# Patient Record
Sex: Female | Born: 1940 | Race: White | Hispanic: No | Smoking: Never smoker
Health system: Southern US, Community
[De-identification: ages and names within clinical notes are randomized; demographics above are authoritative.]

## PROBLEM LIST (undated history)

## (undated) DIAGNOSIS — I4891 Unspecified atrial fibrillation: Secondary | ICD-10-CM

## (undated) DIAGNOSIS — I10 Essential (primary) hypertension: Secondary | ICD-10-CM

## (undated) DIAGNOSIS — I639 Cerebral infarction, unspecified: Secondary | ICD-10-CM

## (undated) DIAGNOSIS — G459 Transient cerebral ischemic attack, unspecified: Secondary | ICD-10-CM

## (undated) DIAGNOSIS — E119 Type 2 diabetes mellitus without complications: Secondary | ICD-10-CM

## (undated) HISTORY — PX: ABDOMINAL HYSTERECTOMY: SHX81

## (undated) HISTORY — PX: CHOLECYSTECTOMY: SHX55

---

## 2016-10-17 ENCOUNTER — Inpatient Hospital Stay (HOSPITAL_BASED_OUTPATIENT_CLINIC_OR_DEPARTMENT_OTHER)
Admission: EM | Admit: 2016-10-17 | Discharge: 2016-10-19 | DRG: 063 | Disposition: A | Discharge status: Home / Self Care | Payer: Medicare Other | Attending: Neurology | Admitting: Neurology

## 2016-10-17 ENCOUNTER — Emergency Department (HOSPITAL_BASED_OUTPATIENT_CLINIC_OR_DEPARTMENT_OTHER): Payer: Medicare Other

## 2016-10-17 ENCOUNTER — Encounter (HOSPITAL_BASED_OUTPATIENT_CLINIC_OR_DEPARTMENT_OTHER): Payer: Self-pay | Admitting: *Deleted

## 2016-10-17 DIAGNOSIS — H5702 Anisocoria: Secondary | ICD-10-CM

## 2016-10-17 DIAGNOSIS — I482 Chronic atrial fibrillation, unspecified: Secondary | ICD-10-CM

## 2016-10-17 DIAGNOSIS — I10 Essential (primary) hypertension: Secondary | ICD-10-CM | POA: Diagnosis present

## 2016-10-17 DIAGNOSIS — E669 Obesity, unspecified: Secondary | ICD-10-CM | POA: Diagnosis present

## 2016-10-17 DIAGNOSIS — I639 Cerebral infarction, unspecified: Secondary | ICD-10-CM | POA: Diagnosis not present

## 2016-10-17 DIAGNOSIS — Z90710 Acquired absence of both cervix and uterus: Secondary | ICD-10-CM

## 2016-10-17 DIAGNOSIS — R29716 NIHSS score 16: Secondary | ICD-10-CM | POA: Diagnosis present

## 2016-10-17 DIAGNOSIS — I63432 Cerebral infarction due to embolism of left posterior cerebral artery: Principal | ICD-10-CM | POA: Diagnosis present

## 2016-10-17 DIAGNOSIS — E119 Type 2 diabetes mellitus without complications: Secondary | ICD-10-CM | POA: Diagnosis present

## 2016-10-17 DIAGNOSIS — Z6831 Body mass index (BMI) 31.0-31.9, adult: Secondary | ICD-10-CM

## 2016-10-17 DIAGNOSIS — Z9049 Acquired absence of other specified parts of digestive tract: Secondary | ICD-10-CM

## 2016-10-17 DIAGNOSIS — I959 Hypotension, unspecified: Secondary | ICD-10-CM | POA: Diagnosis present

## 2016-10-17 DIAGNOSIS — Z8673 Personal history of transient ischemic attack (TIA), and cerebral infarction without residual deficits: Secondary | ICD-10-CM

## 2016-10-17 DIAGNOSIS — E785 Hyperlipidemia, unspecified: Secondary | ICD-10-CM | POA: Diagnosis present

## 2016-10-17 DIAGNOSIS — Z7901 Long term (current) use of anticoagulants: Secondary | ICD-10-CM

## 2016-10-17 DIAGNOSIS — R4701 Aphasia: Secondary | ICD-10-CM | POA: Diagnosis present

## 2016-10-17 HISTORY — DX: Essential (primary) hypertension: I10

## 2016-10-17 HISTORY — DX: Type 2 diabetes mellitus without complications: E11.9

## 2016-10-17 HISTORY — DX: Cerebral infarction, unspecified: I63.9

## 2016-10-17 HISTORY — DX: Unspecified atrial fibrillation: I48.91

## 2016-10-17 LAB — COMPREHENSIVE METABOLIC PANEL
ALBUMIN: 4 g/dL (ref 3.5–5.0)
ALK PHOS: 55 U/L (ref 38–126)
ALT: 23 U/L (ref 14–54)
AST: 22 U/L (ref 15–41)
Anion gap: 8 (ref 5–15)
BILIRUBIN TOTAL: 0.5 mg/dL (ref 0.3–1.2)
BUN: 29 mg/dL — ABNORMAL HIGH (ref 6–20)
CALCIUM: 9.3 mg/dL (ref 8.9–10.3)
CO2: 28 mmol/L (ref 22–32)
CREATININE: 1.04 mg/dL — AB (ref 0.44–1.00)
Chloride: 102 mmol/L (ref 101–111)
GFR calc non Af Amer: 50 mL/min — ABNORMAL LOW (ref >60)
GFR, EST AFRICAN AMERICAN: 59 mL/min — AB (ref >60)
GLUCOSE: 171 mg/dL — AB (ref 65–99)
Potassium: 3.8 mmol/L (ref 3.5–5.1)
SODIUM: 138 mmol/L (ref 135–145)
Total Protein: 7.1 g/dL (ref 6.5–8.1)

## 2016-10-17 LAB — DIFFERENTIAL
Basophils Absolute: 0 10*3/uL (ref 0.0–0.1)
Basophils Relative: 0 %
EOS PCT: 1 %
Eosinophils Absolute: 0.1 10*3/uL (ref 0.0–0.7)
LYMPHS PCT: 26 %
Lymphs Abs: 2.3 10*3/uL (ref 0.7–4.0)
MONOS PCT: 9 %
Monocytes Absolute: 0.8 10*3/uL (ref 0.1–1.0)
NEUTROS ABS: 5.8 10*3/uL (ref 1.7–7.7)
NEUTROS PCT: 64 %

## 2016-10-17 LAB — CBC
HEMATOCRIT: 39.3 % (ref 36.0–46.0)
HEMOGLOBIN: 13.3 g/dL (ref 12.0–15.0)
MCH: 31.4 pg (ref 26.0–34.0)
MCHC: 33.8 g/dL (ref 30.0–36.0)
MCV: 92.9 fL (ref 78.0–100.0)
Platelets: 185 10*3/uL (ref 150–400)
RBC: 4.23 MIL/uL (ref 3.87–5.11)
RDW: 12.4 % (ref 11.5–15.5)
WBC: 9 10*3/uL (ref 4.0–10.5)

## 2016-10-17 LAB — ETHANOL

## 2016-10-17 LAB — TROPONIN I: Troponin I: <0.03 ng/mL (ref <0.03)

## 2016-10-17 LAB — APTT: aPTT: 33 seconds (ref 24–36)

## 2016-10-17 LAB — PROTIME-INR
INR: 1.55
Prothrombin Time: 18.7 seconds — ABNORMAL HIGH (ref 11.4–15.2)

## 2016-10-17 NOTE — ED Notes (Signed)
Pt with right upper extremity weakness 40 min PTA per family.

## 2016-10-17 NOTE — ED Provider Notes (Signed)
[facility] Provider Note   CSN: [id number] Arrival date & time: 10/17/16  2232     History   Chief Complaint Chief Complaint  Patient presents with  . Extremity Weakness    HPI Alexa Fox is a 76 y.o. female.  HPI Patient was at normal baseline at this evening. Family had had a party and she was cleaning up. At 945 she reported that her right arm was not working right. She was not able to elevate it or move it. Her daughter tried to ask her questions and she was having a difficult time responding to questions. She did not have fall or injury. No loss of consciousness. Patient has history of prior stroke approximately 8 years ago. She is anticoagulated for chronic atrial fibrillation on Coumadin 2.5 mg daily. Past Medical History:  Diagnosis Date  . A-fib (HCC)   . Diabetes mellitus without complication (HCC)   . Hypertension   . Stroke [facility](HCC)     There are no active problems to display for this patient.   Past Surgical History:  Procedure Laterality Date  . ABDOMINAL HYSTERECTOMY    . CHOLECYSTECTOMY      OB History    No data available       Home Medications    Prior to Admission medications   Medication Sig Start Date End Date Taking? Authorizing Provider  furosemide (LASIX) 40 MG tablet Take 40 mg by mouth.   Yes [provider]  metFORMIN (GLUMETZA) 500 MG (MOD) 24 hr tablet Take 500 mg by mouth daily with breakfast.   Yes [provider]  metoprolol succinate (TOPROL-XL) 50 MG 24 hr tablet Take 50 mg by mouth daily. Take with or immediately following a meal.   Yes [provider]  potassium chloride SA (K-DUR,KLOR-CON) 20 MEQ tablet Take 20 mEq by mouth 2 (two) times daily.   Yes [provider]  simvastatin (ZOCOR) 20 MG tablet Take 20 mg by mouth daily.   Yes [provider]  warfarin (COUMADIN) 4 MG tablet Take 4 mg by mouth daily.   Yes [provider]    Family History History  reviewed. No pertinent family history.  Social History Social History  Substance Use Topics  . Smoking status: Never Smoker  . Smokeless tobacco: Never Used  . Alcohol use No     Allergies   Patient has no known allergies.   Review of Systems Review of Systems 10 Systems reviewed and are negative for acute change except as noted in the HPI.   Physical Exam Updated Vital Signs There were no vitals taken for this visit.  Physical Exam  Constitutional: No distress.  Patient is awake and alert with protected airway. No respiratory distress.  HENT:  Head: Normocephalic and atraumatic.  Mouth/Throat: Oropharynx is clear and moist.  No pooling secretions or sonorous breathing.  Eyes: Conjunctivae and EOM are normal. Pupils are equal, round, and reactive to light.  Neck: Neck supple.  Cardiovascular:  No murmur heard. Irregularly irregular, rate controlled.  Pulmonary/Chest: Effort normal and breath sounds normal. No respiratory distress.  Abdominal: Soft. There is no tenderness.  Musculoskeletal: She exhibits no edema or tenderness.  Neurological: She is alert.  Patient is awake and alert but seems to have fairly flat affect. Is able to say her name but sleep just slightly slurred. Left facial droop. Patient has focal weakness on the right. She can make some effort to elevate the right upper extremity but she cannot hold  it off the bed. This decreased. She is able to elevate left upper extremity. A command patient can elevate each lower extremity independently.  Skin: Skin is warm and dry.  Psychiatric: She has a normal mood and affect.  Nursing note and vitals reviewed.    ED Treatments / Results  Labs (all labs ordered are listed, but only abnormal results are displayed) Labs Reviewed  PROTIME-INR  ETHANOL  APTT  CBC  DIFFERENTIAL  COMPREHENSIVE METABOLIC PANEL  RAPID URINE DRUG SCREEN, HOSP PERFORMED  URINALYSIS, ROUTINE W REFLEX MICROSCOPIC  TROPONIN I     EKG  EKG Interpretation None       Radiology No results found.  Procedures Procedures (including critical care time) CRITICAL CARE Performed by: Arby Barrette   Total critical care time: 30 minutes  Critical care time was exclusive of separately billable procedures and treating other patients.  Critical care was necessary to treat or prevent imminent or life-threatening deterioration.  Critical care was time spent personally by me on the following activities: development of treatment plan with patient and/or surrogate as well as nursing, discussions with consultants, evaluation of patient's response to treatment, examination of patient, obtaining history from patient or surrogate, ordering and performing treatments and interventions, ordering and review of laboratory studies, ordering and review of radiographic studies, pulse oximetry and re-evaluation of patient's condition. Medications Ordered in ED Medications - No data to display   Initial Impression / Assessment and Plan / ED Course  I have reviewed the triage vital signs and the nursing notes.  Pertinent labs & imaging results that were available during my care of the patient were reviewed by me and considered in my medical decision making (see chart for details).     Consult: Reviewed with Dr. Amada Jupiter for code stroke.  Final Clinical Impressions(s) / ED Diagnoses   Final diagnoses:  Cerebrovascular accident (CVA), unspecified mechanism (HCC)  Chronic atrial fibrillation (HCC)  Anticoagulated on Coumadin  Patient transferred to [facility] for acute CVA with planned administration of TPA per Dr. Amada Jupiter upon arrival to [facility], patient will be within TPA window. Patient remains alert and appropriate with stable VS at transfer. No airway compromise. Patient had acute onset of right-sided weakness and slurred speech. Patient is anticoagulated on coumadin.  New Prescriptions New Prescriptions   No  medications on file     Arby Barrette, MD 10/25/16 [phone]

## 2016-10-17 NOTE — ED Triage Notes (Signed)
Pt with right arm weakness 30 min PTA

## 2016-10-18 ENCOUNTER — Inpatient Hospital Stay (HOSPITAL_COMMUNITY): Payer: Medicare Other

## 2016-10-18 ENCOUNTER — Emergency Department (HOSPITAL_COMMUNITY): Payer: Medicare Other

## 2016-10-18 ENCOUNTER — Encounter (HOSPITAL_COMMUNITY): Payer: Self-pay | Admitting: *Deleted

## 2016-10-18 DIAGNOSIS — E785 Hyperlipidemia, unspecified: Secondary | ICD-10-CM | POA: Diagnosis present

## 2016-10-18 DIAGNOSIS — Z7901 Long term (current) use of anticoagulants: Secondary | ICD-10-CM | POA: Diagnosis not present

## 2016-10-18 DIAGNOSIS — I360 Nonrheumatic tricuspid (valve) stenosis: Secondary | ICD-10-CM | POA: Diagnosis not present

## 2016-10-18 DIAGNOSIS — I63412 Cerebral infarction due to embolism of left middle cerebral artery: Secondary | ICD-10-CM | POA: Diagnosis not present

## 2016-10-18 DIAGNOSIS — R4701 Aphasia: Secondary | ICD-10-CM

## 2016-10-18 DIAGNOSIS — I639 Cerebral infarction, unspecified: Secondary | ICD-10-CM | POA: Diagnosis present

## 2016-10-18 DIAGNOSIS — I4891 Unspecified atrial fibrillation: Secondary | ICD-10-CM | POA: Diagnosis not present

## 2016-10-18 DIAGNOSIS — I482 Chronic atrial fibrillation: Secondary | ICD-10-CM | POA: Diagnosis present

## 2016-10-18 DIAGNOSIS — I959 Hypotension, unspecified: Secondary | ICD-10-CM | POA: Diagnosis present

## 2016-10-18 DIAGNOSIS — I6300 Cerebral infarction due to thrombosis of unspecified precerebral artery: Secondary | ICD-10-CM

## 2016-10-18 DIAGNOSIS — Z90710 Acquired absence of both cervix and uterus: Secondary | ICD-10-CM | POA: Diagnosis not present

## 2016-10-18 DIAGNOSIS — E119 Type 2 diabetes mellitus without complications: Secondary | ICD-10-CM | POA: Diagnosis present

## 2016-10-18 DIAGNOSIS — I10 Essential (primary) hypertension: Secondary | ICD-10-CM | POA: Diagnosis present

## 2016-10-18 DIAGNOSIS — E669 Obesity, unspecified: Secondary | ICD-10-CM | POA: Diagnosis present

## 2016-10-18 DIAGNOSIS — G459 Transient cerebral ischemic attack, unspecified: Secondary | ICD-10-CM

## 2016-10-18 DIAGNOSIS — Z6831 Body mass index (BMI) 31.0-31.9, adult: Secondary | ICD-10-CM | POA: Diagnosis not present

## 2016-10-18 DIAGNOSIS — Z8673 Personal history of transient ischemic attack (TIA), and cerebral infarction without residual deficits: Secondary | ICD-10-CM | POA: Diagnosis not present

## 2016-10-18 DIAGNOSIS — Z9049 Acquired absence of other specified parts of digestive tract: Secondary | ICD-10-CM | POA: Diagnosis not present

## 2016-10-18 DIAGNOSIS — I63432 Cerebral infarction due to embolism of left posterior cerebral artery: Secondary | ICD-10-CM | POA: Diagnosis present

## 2016-10-18 HISTORY — DX: Transient cerebral ischemic attack, unspecified: G45.9

## 2016-10-18 LAB — BASIC METABOLIC PANEL
Anion gap: 11 (ref 5–15)
BUN: 24 mg/dL — AB (ref 6–20)
CO2: 20 mmol/L — ABNORMAL LOW (ref 22–32)
CREATININE: 0.92 mg/dL (ref 0.44–1.00)
Calcium: 8.6 mg/dL — ABNORMAL LOW (ref 8.9–10.3)
Chloride: 109 mmol/L (ref 101–111)
GFR calc Af Amer: >60 mL/min (ref >60)
GFR, EST NON AFRICAN AMERICAN: 59 mL/min — AB (ref >60)
Glucose, Bld: 126 mg/dL — ABNORMAL HIGH (ref 65–99)
POTASSIUM: 4.1 mmol/L (ref 3.5–5.1)
SODIUM: 140 mmol/L (ref 135–145)

## 2016-10-18 LAB — RAPID URINE DRUG SCREEN, HOSP PERFORMED
Amphetamines: NOT DETECTED
Barbiturates: NOT DETECTED
Benzodiazepines: NOT DETECTED
Cocaine: NOT DETECTED
OPIATES: NOT DETECTED
TETRAHYDROCANNABINOL: NOT DETECTED

## 2016-10-18 LAB — LIPID PANEL
Cholesterol: 157 mg/dL (ref 0–200)
HDL: 55 mg/dL (ref >40)
LDL CALC: 85 mg/dL (ref 0–99)
TRIGLYCERIDES: 85 mg/dL (ref <150)
Total CHOL/HDL Ratio: 2.9 RATIO
VLDL: 17 mg/dL (ref 0–40)

## 2016-10-18 LAB — URINALYSIS, ROUTINE W REFLEX MICROSCOPIC
BACTERIA UA: NONE SEEN
BILIRUBIN URINE: NEGATIVE
Glucose, UA: NEGATIVE mg/dL
KETONES UR: NEGATIVE mg/dL
LEUKOCYTES UA: NEGATIVE
NITRITE: NEGATIVE
PH: 5 (ref 5.0–8.0)
Protein, ur: NEGATIVE mg/dL
Specific Gravity, Urine: 1.009 (ref 1.005–1.030)
Squamous Epithelial / LPF: NONE SEEN

## 2016-10-18 LAB — MRSA PCR SCREENING: MRSA by PCR: NEGATIVE

## 2016-10-18 LAB — GLUCOSE, CAPILLARY
GLUCOSE-CAPILLARY: 115 mg/dL — AB (ref 65–99)
GLUCOSE-CAPILLARY: 130 mg/dL — AB (ref 65–99)
GLUCOSE-CAPILLARY: 85 mg/dL (ref 65–99)
Glucose-Capillary: 111 mg/dL — ABNORMAL HIGH (ref 65–99)
Glucose-Capillary: 101 mg/dL — ABNORMAL HIGH (ref 65–99)

## 2016-10-18 LAB — CBC
HCT: 38.6 % (ref 36.0–46.0)
Hemoglobin: 12.5 g/dL (ref 12.0–15.0)
MCH: 30.3 pg (ref 26.0–34.0)
MCHC: 32.4 g/dL (ref 30.0–36.0)
MCV: 93.5 fL (ref 78.0–100.0)
PLATELETS: 140 10*3/uL — AB (ref 150–400)
RBC: 4.13 MIL/uL (ref 3.87–5.11)
RDW: 12.8 % (ref 11.5–15.5)
WBC: 9.7 10*3/uL (ref 4.0–10.5)

## 2016-10-18 MED ORDER — SODIUM CHLORIDE 0.9 % IV BOLUS (SEPSIS)
1000.0000 mL | Freq: Once | INTRAVENOUS | Status: AC
  Administered 2016-10-18: 1000 mL via INTRAVENOUS

## 2016-10-18 MED ORDER — SIMVASTATIN 20 MG PO TABS
20.0000 mg | ORAL_TABLET | Freq: Every day | ORAL | Status: DC
  Administered 2016-10-18: 20 mg via ORAL
  Filled 2016-10-18: qty 1

## 2016-10-18 MED ORDER — ALBUMIN HUMAN 5 % IV SOLN
12.5000 g | Freq: Once | INTRAVENOUS | Status: AC
  Administered 2016-10-18: 12.5 g via INTRAVENOUS
  Filled 2016-10-18: qty 250

## 2016-10-18 MED ORDER — ALTEPLASE (STROKE) FULL DOSE INFUSION
0.9000 mg/kg | Freq: Once | INTRAVENOUS | Status: AC
  Administered 2016-10-18: 73 mg via INTRAVENOUS
  Filled 2016-10-18: qty 100

## 2016-10-18 MED ORDER — TRANEXAMIC ACID 1000 MG/10ML IV SOLN: 1000.0000 mg | INTRAVENOUS | Status: DC

## 2016-10-18 MED ORDER — ACETAMINOPHEN 325 MG PO TABS: 650.0000 mg | ORAL_TABLET | ORAL | Status: DC | PRN

## 2016-10-18 MED ORDER — SODIUM CHLORIDE 0.9 % IV BOLUS (SEPSIS)
500.0000 mL | Freq: Once | INTRAVENOUS | Status: AC
  Administered 2016-10-18: 500 mL via INTRAVENOUS

## 2016-10-18 MED ORDER — ACETAMINOPHEN 650 MG RE SUPP
650.0000 mg | RECTAL | Status: DC | PRN
  Administered 2016-10-18 (×2): 650 mg via RECTAL
  Filled 2016-10-18 (×2): qty 1

## 2016-10-18 MED ORDER — ONDANSETRON HCL 4 MG/2ML IJ SOLN
4.0000 mg | Freq: Four times a day (QID) | INTRAMUSCULAR | Status: DC | PRN
  Administered 2016-10-18: 4 mg via INTRAVENOUS
  Filled 2016-10-18: qty 2

## 2016-10-18 MED ORDER — STROKE: EARLY STAGES OF RECOVERY BOOK
Freq: Once | Status: AC
  Administered 2016-10-18: 1
  Filled 2016-10-18: qty 1

## 2016-10-18 MED ORDER — LABETALOL HCL 5 MG/ML IV SOLN: 20.0000 mg | Freq: Once | INTRAVENOUS | Status: DC | PRN

## 2016-10-18 MED ORDER — CHLORHEXIDINE GLUCONATE 0.12 % MT SOLN
15.0000 mL | Freq: Two times a day (BID) | OROMUCOSAL | Status: DC
  Administered 2016-10-18: 15 mL via OROMUCOSAL

## 2016-10-18 MED ORDER — ORAL CARE MOUTH RINSE
15.0000 mL | Freq: Two times a day (BID) | OROMUCOSAL | Status: DC
  Administered 2016-10-18: 15 mL via OROMUCOSAL

## 2016-10-18 MED ORDER — IOPAMIDOL (ISOVUE-370) INJECTION 76%
INTRAVENOUS | Status: AC
  Administered 2016-10-18: 50 mL via INTRAVENOUS
  Filled 2016-10-18: qty 50

## 2016-10-18 MED ORDER — ACETAMINOPHEN 160 MG/5ML PO SOLN: 650.0000 mg | ORAL | Status: DC | PRN

## 2016-10-18 MED ORDER — GI COCKTAIL ~~LOC~~
30.0000 mL | Freq: Once | ORAL | Status: AC
  Administered 2016-10-19: 30 mL via ORAL
  Filled 2016-10-18: qty 30

## 2016-10-18 MED ORDER — NICARDIPINE HCL IN NACL 20-0.86 MG/200ML-% IV SOLN: 0.0000 mg/h | Freq: Once | INTRAVENOUS | Status: DC | PRN

## 2016-10-18 MED ORDER — SODIUM CHLORIDE 0.9 % IV SOLN
INTRAVENOUS | Status: DC
  Administered 2016-10-18: 03:00:00 via INTRAVENOUS

## 2016-10-18 MED ORDER — INSULIN ASPART 100 UNIT/ML ~~LOC~~ SOLN
2.0000 [IU] | SUBCUTANEOUS | Status: DC
  Administered 2016-10-18: 2 [IU] via SUBCUTANEOUS
  Administered 2016-10-19: 4 [IU] via SUBCUTANEOUS

## 2016-10-18 MED ORDER — PANTOPRAZOLE SODIUM 40 MG IV SOLR
40.0000 mg | Freq: Every day | INTRAVENOUS | Status: DC
  Administered 2016-10-18: 40 mg via INTRAVENOUS
  Filled 2016-10-18: qty 40

## 2016-10-18 NOTE — ED Notes (Signed)
Family at bedside reports patient was shaking R foot [facility]

## 2016-10-18 NOTE — ED Notes (Signed)
tPA started at [facility]0016

## 2016-10-18 NOTE — Progress Notes (Signed)
Preliminary results by tech - Carotid Duplex Completed. No evidence of significant stenosis in bilateral carotid arteries.  Marilynne Halstedita Rubyann Lingle, BS, RDMS, RVT

## 2016-10-18 NOTE — Progress Notes (Signed)
Patient's BP still not at goal, MAP >65 and SBP>100. Neurology MD made aware. Albumin ordered. Will continue to monitor.

## 2016-10-18 NOTE — Evaluation (Signed)
Physical Therapy Evaluation Patient Details Name: Alexa Fox MRN: [id number] DOB: 06/14/1940 Today's Date: 10/18/2016   History of Present Illness  Alexa BoucheSharon Jenkinsis a 76 y.o.femalewith a past medical history of A. fib, diabetes, hypertension, stroke who presents with acute onset right-sided weakness and aphasia. tpA given.  Clinical Impression  Pt admitted with above. Pt functioning near baseline. Pt mildly unsteady with R sided weakness however pt sleepy due to not having slept all night. PT to see pt tomorrow and reassess d/c recs. Pt from [place] but here visiting daughter and will go home with daughter and per dtr pt to move in with another daughter back in [place] when she returns.    Follow Up Recommendations Home health PT;Supervision/Assistance - 24 hour (however may progress and not need HHPT)    Equipment Recommendations  None recommended by PT (may benefit from cane)    Recommendations for Other Services       Precautions / Restrictions Precautions Precautions: Fall Restrictions Weight Bearing Restrictions: No      Mobility  Bed Mobility Overal bed mobility: Needs Assistance Bed Mobility: Supine to Sit;Sit to Supine     Supine to sit: Min guard Sit to supine: Min guard   General bed mobility comments: labored effort but able to complete without physical assist,  Transfers Overall transfer level: Needs assistance Equipment used: None Transfers: Sit to/from Stand Sit to Stand: Min assist         General transfer comment: minA for stability and safety  Ambulation/Gait Ambulation/Gait assistance: Min assist Ambulation Distance (Feet): 100 Feet Assistive device: 1 person hand held assist Gait Pattern/deviations: Step-through pattern;Decreased stride length;Shuffle Gait velocity: slow Gait velocity interpretation: Below normal speed for age/gender General Gait Details: pt mildly unsteady with lateral sway L to R  Stairs            Wheelchair  Mobility    Modified Rankin (Stroke Patients Only) Modified Rankin (Stroke Patients Only) Pre-Morbid Rankin Score: No significant disability Modified Rankin: Moderate disability     Balance Overall balance assessment: Needs assistance Sitting-balance support: Feet supported;No upper extremity supported Sitting balance-Leahy Scale: Good     Standing balance support: No upper extremity supported;During functional activity Standing balance-Leahy Scale: Fair Standing balance comment: pt stood at sink to wash hands, mild LOB due to uneven floor                             Pertinent Vitals/Pain Pain Assessment: No/denies pain    Home Living Family/patient expects to be discharged to:: Private residence Living Arrangements: Alone (visiting from [place], will go home with dtr) Available Help at Discharge: Family;Available 24 hours/day Type of Home: House Home Access: Stairs to enter Entrance Stairs-Rails: Right Entrance Stairs-Number of Steps: 4/5 Home Layout: Two level Home Equipment: None Additional Comments: per daughter, when pt returns to [place] pt will move in with her daughter    Prior Function Level of Independence: Independent         Comments: pt has never driven, dtr takes her to grocery shop and MD appts     Hand Dominance   Dominant Hand: Right    Extremity/Trunk Assessment   Upper Extremity Assessment Upper Extremity Assessment: RUE deficits/detail RUE Deficits / Details: grossly 4/5    Lower Extremity Assessment Lower Extremity Assessment: RLE deficits/detail RLE Deficits / Details: groslly 4/5    Cervical / Trunk Assessment Cervical / Trunk Assessment: Normal  Communication   Communication: No  difficulties (soft spoken and slow to respond)  Cognition Arousal/Alertness: Awake/alert (but sleepy from not sleeping all night) Behavior During Therapy: Flat affect;Impulsive Overall Cognitive Status: Impaired/Different from baseline Area  of Impairment: Problem solving                             Problem Solving: Slow processing;Requires verbal cues;Requires tactile cues General Comments: pt with no consideration for multiple lines and IV      General Comments General comments (skin integrity, edema, etc.): pt tolieted self with supervision    Exercises     Assessment/Plan    PT Assessment Patient needs continued PT services  PT Problem List Decreased strength;Decreased activity tolerance;Decreased balance;Decreased mobility;Decreased coordination;Decreased cognition;Decreased knowledge of use of DME;Decreased safety awareness       PT Treatment Interventions DME instruction;Gait training;Stair training;Functional mobility training;Therapeutic activities;Therapeutic exercise;Balance training;Neuromuscular re-education;Cognitive remediation    PT Goals (Current goals can be found in the Care Plan section)  Acute Rehab PT Goals Patient Stated Goal: didn't state PT Goal Formulation: With patient/family Time For Goal Achievement: 11/01/16 Potential to Achieve Goals: Good Additional Goals Additional Goal #1: Pt to score >19 on DGI to indicate minimal falls risk.    Frequency Min 4X/week   Barriers to discharge        Co-evaluation               AM-PAC PT "6 Clicks" Daily Activity  Outcome Measure Difficulty turning over in bed (including adjusting bedclothes, sheets and blankets)?: A Little Difficulty moving from lying on back to sitting on the side of the bed? : A Little Difficulty sitting down on and standing up from a chair with arms (e.g., wheelchair, bedside commode, etc,.)?: A Little Help needed moving to and from a bed to chair (including a wheelchair)?: A Little Help needed walking in hospital room?: A Little Help needed climbing 3-5 steps with a railing? : A Lot 6 Click Score: 17    End of Session Equipment Utilized During Treatment: Gait belt Activity Tolerance: Patient  tolerated treatment well Patient left: in bed;with call bell/phone within reach;with bed alarm set;with family/visitor present Nurse Communication: Mobility status PT Visit Diagnosis: Unsteadiness on feet (R26.81)    Time: 1610-9604 PT Time Calculation (min) (ACUTE ONLY): 26 min   Charges:   PT Evaluation $PT Eval Moderate Complexity: 1 Procedure PT Treatments $Gait Training: 8-22 mins   PT G Codes:        Lewis Shock, PT, DPT Pager #: [phone] Office #: [phone]   Sasha Rogel M Zeynep Fantroy 10/18/2016, 4:37 PM

## 2016-10-18 NOTE — H&P (Signed)
Neurology H&P  CC: Aphasia  History is obtained from: Patient  HPI: Alexa Fox is a 76 y.o. female with a past medical history of A. fib, diabetes, hypertension, stroke who presents with acute onset right-sided weakness and aphasia. She is from [place] and is currently visiting. Her daughter states that, the left around 9:30, subsequently she came back in and found her to be aphasic. [facility]. Prior to transfer here, she did have some improvement and was able to speak some on [facility]. She was able to tell me that she had not had any vision problems with past few days, having no symptoms prior to onset of these symptoms.  I discussed IV TPA with her and her daughter and both agreed to proceed.   LKW: 9:30 PM tpa given?: Yes  ROS:  Unable to obtain due to altered mental status.   Past Medical History:  Diagnosis Date  . A-fib (HCC)   . Diabetes mellitus without complication (HCC)   . Hypertension   . Stroke [facility](HCC)      History reviewed. No pertinent family history.   Social History:  reports that she has never smoked. She has never used smokeless tobacco. She reports that she does not drink alcohol or use drugs.   Exam: Current vital signs: BP 100/81   Pulse 80   Temp 98.9 F (37.2 C) (Oral)   Resp 18   Wt 81.3 kg (179 lb 3.7 oz)   SpO2 95%  Vital signs in last 24 hours: Temp:  [98.9 F (37.2 C)] 98.9 F (37.2 C) (07/10 2322) Pulse Rate:  [68-87] 80 (07/11 0101) Resp:  [16-22] 18 (07/11 0101) BP: (100-123)/(50-81) 100/81 (07/11 0101) SpO2:  [95 %-97 %] 95 % (07/11 0101) Weight:  [81.3 kg (179 lb 3.7 oz)] 81.3 kg (179 lb 3.7 oz) (07/11 0000)  Physical Exam  Constitutional: Appears well-developed and well-nourished.  Psych: Affect appropriate to situation Eyes: No scleral injection HENT: No OP obstrucion Head: Normocephalic.  Cardiovascular: Normal rate and regular rhythm.   Respiratory: Effort normal and breath sounds normal to anterior ascultation GI: Soft.  No distension. There is no tenderness.  Skin: WDI  Neuro: Mental Status: Patient is Awake, alert, mild aphasia on arrival, this progressed to moderate to severe aphasia. Cranial Nerves: II: Visual Fields are full. Pupils are equal, round, and reactive to light.   III,IV, VI: EOMI without ptosis or diploplia.  V: Facial sensation is symmetric to temperature VII: Facial movement with right-sided weakness VIII: hearing is intact to voice X: Uvula elevates symmetrically XI: Shoulder shrug is symmetric. XII: tongue is midline without atrophy or fasciculations.  Motor: Tone is normal. Bulk is normal. 4/5 strength in the right arm, otherwise 5/5 Sensory: Sensation is symmetric to light touch and temperature in the arms and legs. Cerebellar: No clear ataxia  I have reviewed labs in epic and the results pertinent to this consultation are: CMP-unremarkable  I have reviewed the images obtained: CT head-small hypodensity in the left posterior quadrant, CTA-negative  Impression: 76 year old female with acute onset right-sided weakness and aphasia suspicious for branch occlusion MCA infarct. She is receiving IV TPA and will need to be admitted and monitored.  Recommendations: 1. HgbA1c, fasting lipid panel 2. MRI, MRA  of the brain without contrast 3. Frequent neuro checks 4. Echocardiogram 5. Carotid dopplers 6. Prophylactic therapy-Antiplatelet med: Aspirin - dose 325mg  PO or 300mg  PR 7. Risk  factor modification 8. Telemetry monitoring 9. PT consult, OT consult, Speech consult 10. please page stroke NP  Or  PA  Or MD  from 8am -4 pm as this patient will be followed by the stroke team at this point.   You can look them up on [email or web address]   11. SSI  This patient is critically ill and at significant risk of neurological worsening, death and care requires constant monitoring of vital signs,  hemodynamics,respiratory and cardiac monitoring, neurological assessment, discussion with family, other specialists and medical decision making of high complexity. I spent 50 minutes of neurocritical care time  in the care of  this patient.  Ritta Slot, MD [facility] [phone]  If 7pm- 7am, please page neurology on call as listed in AMION. 10/18/2016  1:14 AM

## 2016-10-18 NOTE — ED Provider Notes (Signed)
[facility]-EMERGENCY DEPT Provider Note   CSN: [id number] Arrival date & time: 10/17/16  2232   An emergency department physician performed an initial assessment on this suspected stroke patient at 2249.  History   Chief Complaint Chief Complaint  Patient presents with  . Extremity Weakness    HPI Alexa Fox is a 76 y.o. female who with a hx of A. fib, diabetes, hypertension, stroke presents to the [facility] emergency department after evaluation for acute onset aphasia and right arm weakness onset 945pm. Code stroke initiated at [facility] where pt presented.  She was evaluated by Dr. Arby Barrette. CT scan at that facility showed acute acute left PCA territory infarct and pt was transferred to [facility].  Please see Dr. Fabian Sharp initial full H&P.  The history is provided by the patient, medical records and a relative. The history is limited by the condition of the patient. No language interpreter was used.    Past Medical History:  Diagnosis Date  . A-fib (HCC)   . Diabetes mellitus without complication (HCC)   . Hypertension   . Stroke [facility])     Patient Active Problem List   Diagnosis Date Noted  . Stroke (HCC) 10/18/2016  . 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 ABDOMINAL HYSTERECTOMY    . CHOLECYSTECTOMY      OB History    No data available       Home Medications    Prior to Admission medications   Medication Sig Start Date End Date Taking? Authorizing Provider  furosemide (LASIX) 40 MG tablet Take 40 mg by mouth.   Yes [provider]  metFORMIN (GLUMETZA) 500 MG (MOD) 24 hr tablet Take 500 mg by mouth daily with breakfast.   Yes [provider]  metoprolol succinate (TOPROL-XL) 50 MG 24 hr tablet Take 50 mg by mouth daily. Take with or immediately following a meal.   Yes [provider]  potassium chloride SA (K-DUR,KLOR-CON) 20 MEQ tablet Take 20 mEq by mouth 2 (two)  times daily.   Yes [provider]  simvastatin (ZOCOR) 20 MG tablet Take 20 mg by mouth daily.   Yes [provider]  warfarin (COUMADIN) 4 MG tablet Take 4 mg by mouth daily.   Yes [provider]    Family History History reviewed. No pertinent family history.  Social History Social History  Substance Use Topics  . Smoking status: Never Smoker  . Smokeless tobacco: Never Used  . Alcohol use No     Allergies   Patient has no known allergies.   Review of Systems Review of Systems  Unable to perform ROS: Acuity of condition  Musculoskeletal: Positive for extremity weakness.     Physical Exam Updated Vital Signs BP 98/64   Pulse 82   Temp 98.9 F (37.2 C) (Oral)   Resp 20   Wt 81.3 kg (179 lb 3.7 oz)   SpO2 94%   Physical Exam  Constitutional: She appears well-developed and well-nourished. No distress.  HENT:  Head: Normocephalic.  Airway patent  Eyes: Conjunctivae are normal. No scleral icterus.  Neck: Normal range of motion.  Cardiovascular: Normal rate and intact distal pulses.   Pulses:      Radial pulses are 2+ on the right side, and 2+ on the left side.       Dorsalis pedis pulses are 2+ on the right side, and 2+ on the left side.  Pulmonary/Chest:  Effort normal.  Abdominal: Soft.  Musculoskeletal: Normal range of motion.  Neurological: She is alert.  Slurred speech with some aphasia Difficulty following all commands No arm drift or leg drift Right facial droop persistent  Skin: Skin is warm and dry.  Nursing note and vitals reviewed.    ED Treatments / Results  Labs (all labs ordered are listed, but only abnormal results are displayed) Labs Reviewed  PROTIME-INR - Abnormal; Notable for the following:       Result Value   Prothrombin Time 18.7 (*)    All other components within normal limits  COMPREHENSIVE METABOLIC PANEL - Abnormal; Notable for the following:    Glucose, Bld 171 (*)    BUN 29 (*)    Creatinine,  Ser 1.04 (*)    GFR calc non Af Amer 50 (*)    GFR calc Af Amer 59 (*)    All other components within normal limits  URINALYSIS, ROUTINE W REFLEX MICROSCOPIC - Abnormal; Notable for the following:    Color, Urine STRAW (*)    Hgb urine dipstick SMALL (*)    All other components within normal limits  ETHANOL  APTT  CBC  DIFFERENTIAL  TROPONIN I  RAPID URINE DRUG SCREEN, HOSP PERFORMED  RAPID URINE DRUG SCREEN, HOSP PERFORMED  URINALYSIS, ROUTINE W REFLEX MICROSCOPIC    EKG  EKG Interpretation  Date/Time:  Tuesday October 17 2016 22:42:54 EDT Ventricular Rate:  72 PR Interval:    QRS Duration: 119 QT Interval:  395 QTC Calculation: 433 R Axis:   49 Text Interpretation:  Atrial fibrillation Nonspecific intraventricular conduction delay Anteroseptal infarct, old Borderline repol abnormality, diffuse leads No old tracing to compare Confirmed by Derwood Kaplan [phone]) on 10/18/2016 12:47:46 AM       Radiology Ct Angio Head W Or Wo Contrast  Result Date: 10/18/2016 CLINICAL DATA:  Initial evaluation for acute right arm weakness, status post tPA. EXAM: CT ANGIOGRAPHY HEAD AND NECK TECHNIQUE: Multidetector CT imaging of the head and neck was performed using the standard protocol during bolus administration of intravenous contrast. Multiplanar CT image reconstructions and MIPs were obtained to evaluate the vascular anatomy. Carotid stenosis measurements (when applicable) are obtained utilizing NASCET criteria, using the distal internal carotid diameter as the denominator. CONTRAST:  50 cc of Isovue 370. COMPARISON:  Prior CT from 10/17/2016. FINDINGS: CTA NECK FINDINGS Aortic arch: Visualized aortic arch of normal caliber with normal 3 vessel morphology. Minimal plaque about the origin of the great vessels without flow-limiting stenosis. Visualized subclavian artery is widely patent. Right carotid system: Right common and internal carotid artery's are widely patent without stenosis,  dissection, or occlusion. No significant atheromatous narrowing about the right carotid bifurcation. Left carotid system: Left common carotid artery widely patent from its origin to the bifurcation. Mild a centric calcified plaque about the left carotid bifurcation without stenosis. Left ICA widely patent distally to the skullbase without stenosis, dissection, or occlusion. Vertebral arteries: Both of the vertebral arteries arise from the subclavian arteries. Vertebral arteries widely patent within the neck without stenosis, dissection, or occlusion. Skeleton: No acute osseous abnormality. No worrisome lytic or blastic osseous lesions. Moderate degenerative spondylolysis present at C5-6 and C6-7. Multilevel facet arthrosis noted within the upper cervical spine. Other neck: Soft tissues of the neck demonstrate no acute abnormality. Salivary glands within normal limits. No adenopathy. Thyroid normal. Upper chest: Visualized upper chest within normal limits. Partially visualized lungs are clear. Review of the MIP images confirms the above findings CTA  HEAD FINDINGS Anterior circulation: Petrous, cavernous, and supraclinoid segments of the internal carotid arteries are widely patent without flow-limiting stenosis. Minimal scattered plaque within the cavernous right ICA noted. ICA termini widely patent. A1 segments patent bilaterally. Anterior communicating artery normal. Anterior cerebral arteries patent to their distal aspects. M1 segments patent without stenosis or occlusion. No proximal M2 occlusion. Distal MCA branches well opacified and symmetric. Posterior circulation: Vertebral artery's widely patent to the vertebrobasilar junction. Left PICA patent. Dominant right AICA. Basilar artery widely patent. Superior cerebral arteries patent bilaterally. Both of the posterior cerebral artery supplied via the basilar and are widely patent to their distal aspects. Small left posterior communicating artery noted. Venous  sinuses: Patent. Anatomic variants: No significant anatomic variant. No aneurysm or vascular malformation. Delayed phase: Not performed. Review of the MIP images confirms the above findings IMPRESSION: 1. Negative CTA for emergent large vessel occlusion. 2. Minor atherosclerotic changes for patient age as above. No high-grade or flow-limiting stenosis. Critical Value/emergent results were discussed by telephone on 10/18/2016 at approximately 12:50 a.m. with Dr. Ritta Slot . Electronically Signed   By: Rise Mu M.D.   On: 10/18/2016 01:25   Ct Head Wo Contrast  Result Date: 10/17/2016 CLINICAL DATA:  Initial evaluation for acute right arm weakness. EXAM: CT HEAD WITHOUT CONTRAST TECHNIQUE: Contiguous axial images were obtained from the base of the skull through the vertex without intravenous contrast. COMPARISON:  None. FINDINGS: Brain: Age-related cerebral atrophy with mild chronic small vessel ischemic disease. Scatter remote lacunar infarcts present within the right basal ganglia. Remote bilateral cerebellar infarcts present. Encephalomalacia within the left parietal lobe consistent with remote left posterior MCA territory infarct. No acute intracranial hemorrhage. There is question of subtle asymmetric hypodensity involving the left occipital pole, which may reflect an evolving acute left PCA territory infarct. No other evidence for acute large vessel territory ischemia. No mass lesion or midline shift. No hydrocephalus. No extra-axial fluid collection. Vascular: No hyperdense vessel. Scattered vascular calcifications noted within the carotid siphons. Skull: Scalp soft tissues within normal limits.  Calvarium intact. Sinuses/Orbits: Globes and orbital soft tissues within normal limits. Paranasal sinuses and mastoids are clear. ASPECTS [facility] Stroke Program Early CT Score) - Ganglionic level infarction (caudate, lentiform nuclei, internal capsule, insula, M1-M3 cortex): 7 -  Supraganglionic infarction (M4-M6 cortex): 3 Total score (0-10 with 10 being normal): 10 IMPRESSION: 1. Question subtle evolving hypodensity within the left occipital pole, suspicious for acute left PCA territory infarct. No acute intracranial hemorrhage. 2. ASPECTS is 10 3. Age-related cerebral atrophy with mild chronic small vessel ischemic disease with scattered remote infarcts as above. Critical Value/emergent results were called by telephone at the time of interpretation on 10/17/2016 at 11:17 pm to Dr. Arby Barrette , who verbally acknowledged these results. Electronically Signed   By: Rise Mu M.D.   On: 10/17/2016 23:21   Ct Angio Neck W Or Wo Contrast  Result Date: 10/18/2016 CLINICAL DATA:  Initial evaluation for acute right arm weakness, status post tPA. EXAM: CT ANGIOGRAPHY HEAD AND NECK TECHNIQUE: Multidetector CT imaging of the head and neck was performed using the standard protocol during bolus administration of intravenous contrast. Multiplanar CT image reconstructions and MIPs were obtained to evaluate the vascular anatomy. Carotid stenosis measurements (when applicable) are obtained utilizing NASCET criteria, using the distal internal carotid diameter as the denominator. CONTRAST:  50 cc of Isovue 370. COMPARISON:  Prior CT from 10/17/2016. FINDINGS: CTA NECK FINDINGS Aortic arch: Visualized aortic arch of normal caliber with  normal 3 vessel morphology. Minimal plaque about the origin of the great vessels without flow-limiting stenosis. Visualized subclavian artery is widely patent. Right carotid system: Right common and internal carotid artery's are widely patent without stenosis, dissection, or occlusion. No significant atheromatous narrowing about the right carotid bifurcation. Left carotid system: Left common carotid artery widely patent from its origin to the bifurcation. Mild a centric calcified plaque about the left carotid bifurcation without stenosis. Left ICA widely patent  distally to the skullbase without stenosis, dissection, or occlusion. Vertebral arteries: Both of the vertebral arteries arise from the subclavian arteries. Vertebral arteries widely patent within the neck without stenosis, dissection, or occlusion. Skeleton: No acute osseous abnormality. No worrisome lytic or blastic osseous lesions. Moderate degenerative spondylolysis present at C5-6 and C6-7. Multilevel facet arthrosis noted within the upper cervical spine. Other neck: Soft tissues of the neck demonstrate no acute abnormality. Salivary glands within normal limits. No adenopathy. Thyroid normal. Upper chest: Visualized upper chest within normal limits. Partially visualized lungs are clear. Review of the MIP images confirms the above findings CTA HEAD FINDINGS Anterior circulation: Petrous, cavernous, and supraclinoid segments of the internal carotid arteries are widely patent without flow-limiting stenosis. Minimal scattered plaque within the cavernous right ICA noted. ICA termini widely patent. A1 segments patent bilaterally. Anterior communicating artery normal. Anterior cerebral arteries patent to their distal aspects. M1 segments patent without stenosis or occlusion. No proximal M2 occlusion. Distal MCA branches well opacified and symmetric. Posterior circulation: Vertebral artery's widely patent to the vertebrobasilar junction. Left PICA patent. Dominant right AICA. Basilar artery widely patent. Superior cerebral arteries patent bilaterally. Both of the posterior cerebral artery supplied via the basilar and are widely patent to their distal aspects. Small left posterior communicating artery noted. Venous sinuses: Patent. Anatomic variants: No significant anatomic variant. No aneurysm or vascular malformation. Delayed phase: Not performed. Review of the MIP images confirms the above findings IMPRESSION: 1. Negative CTA for emergent large vessel occlusion. 2. Minor atherosclerotic changes for patient age as  above. No high-grade or flow-limiting stenosis. Critical Value/emergent results were discussed by telephone on 10/18/2016 at approximately 12:50 a.m. with Dr. Ritta SlotMCNEILL KIRKPATRICK . Electronically Signed   By: Rise MuBenjamin  McClintock M.D.   On: 10/18/2016 01:25    Procedures Procedures (including critical care time)  CRITICAL CARE Performed by: Dierdre ForthMuthersbaugh, Mayola Mcbain Total critical care time: 45 minutes Critical care time was exclusive of separately billable procedures and treating other patients. Critical care was necessary to treat or prevent imminent or life-threatening deterioration. Critical care was time spent personally by me on the following activities: development of treatment plan with patient and/or surrogate as well as nursing, discussions with consultants, evaluation of patient's response to treatment, examination of patient, obtaining history from patient or surrogate, ordering and performing treatments and interventions, ordering and review of laboratory studies, ordering and review of radiographic studies, pulse oximetry and re-evaluation of patient's condition.   Medications Ordered in ED Medications  alteplase (ACTIVASE) 1 mg/mL infusion 73 mg (0 mg/kg  81.3 kg Intravenous Stopped 10/18/16 0116)  iopamidol (ISOVUE-370) 76 % injection (50 mLs Intravenous Contrast Given 10/18/16 0030)  sodium chloride 0.9 % bolus 1,000 mL (1,000 mLs Intravenous New Bag/Given 10/18/16 0054)     Initial Impression / Assessment and Plan / ED Course  I have reviewed the triage vital signs and the nursing notes.  Pertinent labs & imaging results that were available during my care of the patient were reviewed by me and considered in my medical decision  making (see chart for details).     Patient presents to [facility] as a code stroke from [facility]. Patient was immediately evaluated by Dr. Amada Jupiter and tPA was initiated.  Pt with persistent aphasia after tPA initiated.  Pt to be admitted  to neuro.  She remains hemodynamically stable in the ED.  Final Clinical Impressions(s) / ED Diagnoses   Final diagnoses:  Cerebrovascular accident (CVA), unspecified mechanism (HCC)  Chronic atrial fibrillation (HCC)  Anticoagulated on Coumadin    New Prescriptions New Prescriptions   No medications on file     Milta Deiters 10/18/16 0141    Derwood Kaplan, MD 10/18/16 [phone]

## 2016-10-18 NOTE — Evaluation (Signed)
Speech Language Pathology Evaluation Patient Details Name: Alexa Fox MRN: [id number] DOB: 10/13/1940 Today's Date: 10/18/2016 Time: [id number] SLP Time Calculation (min) (ACUTE ONLY): 9 min  Problem List:  Patient Active Problem List   Diagnosis Date Noted  . Stroke (HCC) 10/18/2016  . 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 A-fib (HCC)   . Diabetes mellitus without complication (HCC)   . Hypertension   . Stroke [facility](HCC)    Past Surgical History:  Past Surgical History:  Procedure Laterality Date  . ABDOMINAL HYSTERECTOMY    . CHOLECYSTECTOMY     HPI:  Pt is a 76 y.o.femalewith a past medical history of A. fib, diabetes, hypertension, stroke who presents with acute onset right-sided weakness and aphasia, now s/p tPA. CT Head showed concern for a possible L PCA infarct. MRI pending. Pt lives in [place] and is in [place] visiting family.    Assessment / Plan / Recommendation Clinical Impression  Pt is disoriented to situation and shows reduced sustained attention, which also impacts her storage and subsequent recall of new information. She asks repetitively throughout the eval about her daughter coming to visit. Min cues are provided for completion of simple one-step commands. Word-finding errors and intermittent paraphasias are noted in conversation. At baseline pt says she lives alone with some assistance needed for medication and financial management, making it seem likely that the aforementioned deficits are acute changes. Pt will benefit from SLP f/u to maximize functional cognition, communication, and safety.    SLP Assessment  SLP Recommendation/Assessment: Patient needs continued Speech Lanaguage Pathology Services SLP Visit Diagnosis: Aphasia (R47.01);Cognitive communication deficit (R41.841)    Follow Up Recommendations   (tba)    Frequency and Duration min 2x/week  2 weeks      SLP Evaluation Cognition  Overall Cognitive Status: Impaired/Different from baseline Arousal/Alertness: Awake/alert Orientation Level: Oriented to person;Oriented to place;Oriented to time;Disoriented to situation Attention: Sustained Sustained Attention: Impaired Sustained Attention Impairment: Verbal basic Memory: Impaired Memory Impairment: Storage deficit;Retrieval deficit;Decreased recall of new information Problem Solving: Impaired Problem Solving Impairment: Verbal basic Safety/Judgment: Impaired       Comprehension  Auditory Comprehension Overall Auditory Comprehension: Impaired Commands: Impaired One Step Basic Commands: 75-100% accurate    Expression Expression Primary Mode of Expression: Verbal Verbal Expression Overall Verbal Expression: Impaired Initiation: No impairment Level of Generative/Spontaneous Verbalization: Conversation Repetition:  (WFL at word level) Naming: Impairment Confrontation: Within functional limits Verbal Errors: Phonemic paraphasias;Other (comment) (anomia) Non-Verbal Means of Communication: Not applicable   Oral / Motor  Oral Motor/Sensory Function Overall Oral Motor/Sensory Function: Mild impairment Facial ROM: Reduced right;Suspected CN VII (facial) dysfunction Facial Symmetry: Abnormal symmetry right;Suspected CN VII (facial) dysfunction Facial Strength: Reduced right;Suspected CN VII (facial) dysfunction Motor Speech Overall Motor Speech: Appears within functional limits for tasks assessed   GO                    Maxcine Hamaiewonsky, Makensey Rego 10/18/2016, 12:15 PM  Maxcine HamLaura Paiewonsky, M.A. CCC-SLP [phone]

## 2016-10-18 NOTE — Progress Notes (Signed)
PT Cancellation Note  Patient Details Name: Alexa Fox Mahar MRN: [id number] DOB: 01/13/1941   Cancelled Treatment:    Reason Eval/Treat Not Completed: Patient not medically ready. Pt currently on strict bedrest. Please update activity orders when pt is appropriate for PT eval. Thank you   Marylynn PearsonLaura D Shirlette Scarber 10/18/2016, 7:28 AM   Conni SlipperLaura Ayan Yankey, PT, DPT Acute Rehabilitation Services Pager: [phone]

## 2016-10-18 NOTE — Progress Notes (Signed)
Patient's pupils unequal, NIH went from 3 to 7. Neurology made aware, Stat CT head ordered.

## 2016-10-18 NOTE — Progress Notes (Signed)
STROKE TEAM PROGRESS NOTE   HISTORY OF PRESENT ILLNESS (per record) Alexa Fox is a 76 y.o. female with a past medical history of A. fib, diabetes, hypertension, and stroke who presents with acute onset right-sided weakness and aphasia. She is from [place] and is currently visiting. Her daughter states that , the left around 9:30, subsequently she came back in and found her to be aphasic. They brought her to the emergency department at [facility]. Prior to transfer here, she did have some improvement and was able to speak some on arrival to the ED at [facility]. She was able to tell me that she had not had any vision problems with past few days, having no symptoms prior to onset of these symptoms.  CT head 10/17/2016 with questionable L PCA infarct.  MRI pending.  I discussed IV TPA with her and her daughter and both agreed to proceed.  LKW: 9:30 PM on 10/17/2016 tpa given?: Yes  Patient was administered IV t-PA at 0016 on 10/17/2016.  She was admitted to the neuro ICU for further evaluation and treatment.   SUBJECTIVE (INTERVAL HISTORY) Her nurse is at the bedside.  The patient is alert, oriented, and follows all commands appropriately.  Patient remains hypotensive after 2L NS bolus with Tmax of 100.6, responding to Tylenol.  Creatinine 1.04->0.92 with good urine output.  Troponin negative.  Echo pending.  MRI ordered for this afternoon.  Pt is in a flutter on coumadin with subtherapeutic INR.  Consider transitioning Eliquis after the acute bleed risk period.   OBJECTIVE Temp:  [98.8 F (37.1 C)-100.6 F (38.1 C)] 99.7 F (37.6 C) (07/11 1200) Pulse Rate:  [67-142] 78 (07/11 1200) Cardiac Rhythm: Atrial fibrillation (07/11 1200) Resp:  [10-24] 14 (07/11 1200) BP: (56-128)/(40-83) 95/49 (07/11 1200) SpO2:  [92 %-100 %] 100 % (07/11 1200) Weight:  [79.2 kg (174 lb 9.7 oz)-81.3 kg (179 lb 3.7 oz)] 79.2 kg (174 lb 9.7 oz) (07/11 0145)  CBC:   Recent Labs Lab 10/17/16 2245  10/18/16 0428  WBC 9.0 9.7  NEUTROABS 5.8  --   HGB 13.3 12.5  HCT 39.3 38.6  MCV 92.9 93.5  PLT 185 140*    Basic Metabolic Panel:   Recent Labs Lab 10/17/16 2245 10/18/16 0428  NA 138 140  K 3.8 4.1  CL 102 109  CO2 28 20*  GLUCOSE 171* 126*  BUN 29* 24*  CREATININE 1.04* 0.92  CALCIUM 9.3 8.6*    Lipid Panel:     Component Value Date/Time   CHOL 157 10/18/2016 0428   TRIG 85 10/18/2016 0428   HDL 55 10/18/2016 0428   CHOLHDL 2.9 10/18/2016 0428   VLDL 17 10/18/2016 0428   LDLCALC 85 10/18/2016 0428   HgbA1c: No results found for: HGBA1C Urine Drug Screen:     Component Value Date/Time   LABOPIA NONE DETECTED 10/18/2016 0018   COCAINSCRNUR NONE DETECTED 10/18/2016 0018   LABBENZ NONE DETECTED 10/18/2016 0018   AMPHETMU NONE DETECTED 10/18/2016 0018   THCU NONE DETECTED 10/18/2016 0018   LABBARB NONE DETECTED 10/18/2016 0018    Alcohol Level     Component Value Date/Time   ETH <5 10/17/2016 2245    IMAGING  Ct Head Wo Contrast 10/18/2016 IMPRESSION: 1. No acute intracranial hemorrhage or other abnormality identified status post tPA. 2. No other acute intracranial process identified. Previously questioned hypodensity at the left occipital pole is not seen on this exam. This was most likely artifactual on prior  study. 3. Stable atrophy with scattered remote infarcts as above.   Ct Head Wo Contrast 10/17/2016 IMPRESSION: 1. Question subtle evolving hypodensity within the left occipital pole, suspicious for acute left PCA territory infarct. No acute intracranial hemorrhage. 2. ASPECTS is 10 3. Age-related cerebral atrophy with mild chronic small vessel ischemic disease with scattered remote infarcts as above.  Ct Angio Neck W and Wo Contrast 10/18/2016 IMPRESSION: 1. Negative CTA for emergent large vessel occlusion. 2. Minor atherosclerotic changes for patient age as above. No high-grade or flow-limiting stenosis.     PHYSICAL EXAM Elderly lady not in  distress. . Afebrile. Head is nontraumatic. Neck is supple without bruit.    Cardiac exam no murmur or gallop. Lungs are clear to auscultation. Distal pulses are well felt.Pulse irregular atrial Fib-flutter Neurological Exam ;  ;  Awake  Alert doriented x 3. Normal speech  And language . Follows only simple midline and one-step commands. eye movements full without nystagmus.fundi were not visualized. Vision acuity and fields appear normal. Hearing is normal. Palatal movements are normal. Face symmetric. Tongue midline. Normal strength, tone, reflexes and coordination. Normal sensation. Gait deferred.  ASSESSMENT/PLAN Ms. Alexa Fox is a 76 y.o. female with history of f A. fib, diabetes, hypertension, and stroke who presents with acute onset right-sided weakness and aphasia. She did not received IV t-PA at 0016 on 10/17/2016.   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 Yes; Fluid consistency: Thin  warfarin daily prior to admission, now on No antithrombotic, received tPA at 0016 on 7/1-/2018  Patient counseled to be compliant with her antithrombotic medications  Ongoing aggressive stroke risk factor management  Therapy recommendations:  24 hour supervision/assistance   Disposition: pending  Hypotension  Unstable  Tmax 100.6  Fluid resuscitation: 2.5 L NS and 1 unit albumin  Long-term BP goal normotensive  Atrial flutter on EKG with RBBB in lead III and old anteroseptal infarct  Troponin negative  Echo pending  Hyperlipidemia  Home meds: simvastatin 20mg  PO daily,  resumed in hospital  LDL 85 , goal < 70  Continue statin at discharge  Diabetes  HgbA1c  pending, goal < 7.0  Controlled  Other Stroke Risk Factors  Advanced age  Obesity, Body mass index is 31.94 kg/m., recommend weight loss, diet and exercise as appropriate   Hx stroke/TIA  Other Active Problems  Low grade temperature, Tmax 100.6  Hospital day # 0  I have personally examined this patient, reviewed notes, independently viewed imaging studies, participated in medical decision making and plan of care.ROS completed by me personally and pertinent positives fully documented  I have made any additions or clarifications directly to the above note. 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 Continue strict blood pressure control as per post TPA protocol. Close neurological monitoring. Check MRI scan. Aspirin later if no bleed on  f/u neuroimaging  . No family available at bedside for discussion. We'll need anticoagulation at discharge This patient is critically ill and  at significant risk of neurological worsening, death and care requires constant monitoring of vital signs, hemodynamics,respiratory and cardiac monitoring, extensive review of multiple databases, frequent neurological assessment, discussion with family, other specialists and medical decision making of high complexity.I have made any additions or clarifications directly to the above note.This critical care time does not reflect procedure time, or teaching time or supervisory time of PA/NP/Med Resident etc but could involve care discussion time.  I spent 35 minutes of neurocritical care time  in the care of  this patient.     Delia Heady, MD Medical Director [facility] Pager: [phone] 10/18/2016 4:24 PM   To contact Stroke Continuity provider, please refer to [email or web address]. After hours, contact General  Neurology

## 2016-10-18 NOTE — Evaluation (Signed)
Clinical/Bedside Swallow Evaluation Patient Details  Name: Alexa MariaSharon Franssen MRN: [id number] Date of Birth: 01/18/1941  Today's Date: 10/18/2016 Time: SLP Start Time (ACUTE ONLY): 1056 SLP Stop Time (ACUTE ONLY): 1106 SLP Time Calculation (min) (ACUTE ONLY): 10 min  Past Medical History:  Past Medical History:  Diagnosis Date  . A-fib (HCC)   . Diabetes mellitus without complication (HCC)   . Hypertension   . Stroke [facility](HCC)    Past Surgical History:  Past Surgical History:  Procedure Laterality Date  . ABDOMINAL HYSTERECTOMY    . CHOLECYSTECTOMY     HPI:  Pt is a 76 y.o.femalewith a past medical history of A. fib, diabetes, hypertension, stroke who presents with acute onset right-sided weakness and aphasia, now s/p tPA. CT Head showed concern for a possible L PCA infarct. MRI pending. Pt lives in [place] and is in [place] visiting family.    Assessment / Plan / Recommendation Clinical Impression  Pt appears to have a functional oropharyngeal swallow despite subtle R sided facial droop. No overt signs of aspiration are observed. Recommend regular textures and thin liquids. SLP to follow for cognitive-linguistic goals only. SLP Visit Diagnosis: Dysphagia, unspecified (R13.10)    Aspiration Risk  Mild aspiration risk    Diet Recommendation Regular;Thin liquid   Liquid Administration via: Cup;Straw Medication Administration: Whole meds with liquid Supervision: Patient able to self feed;Intermittent supervision to cue for compensatory strategies Compensations: Slow rate;Small sips/bites Postural Changes: Seated upright at 90 degrees    Other  Recommendations Oral Care Recommendations: Oral care BID   Follow up Recommendations 24 hour supervision/assistance      Frequency and Duration            Prognosis        Swallow Study   General HPI: Pt is a 76 y.o.femalewith a past medical history of A. fib, diabetes, hypertension, stroke who presents with acute onset right-sided  weakness and aphasia, now s/p tPA. CT Head showed concern for a possible L PCA infarct. MRI pending. Pt lives in [place] and is in [place] visiting family.  Type of Study: Bedside Swallow Evaluation Previous Swallow Assessment: none in chart Diet Prior to this Study: NPO Temperature Spikes Noted: Yes (100.4) Respiratory Status: Room air History of Recent Intubation: No Behavior/Cognition: Alert;Cooperative;Pleasant mood;Requires cueing Oral Cavity Assessment: Within Functional Limits Oral Care Completed by SLP: No Oral Cavity - Dentition: Dentures, top;Dentures, bottom Vision: Functional for self-feeding Self-Feeding Abilities: Able to feed self Patient Positioning: Upright in bed Baseline Vocal Quality: Normal Volitional Cough: Strong Volitional Swallow: Able to elicit    Oral/Motor/Sensory Function Overall Oral Motor/Sensory Function: Mild impairment Facial ROM: Reduced right;Suspected CN VII (facial) dysfunction Facial Symmetry: Abnormal symmetry right;Suspected CN VII (facial) dysfunction Facial Strength: Reduced right;Suspected CN VII (facial) dysfunction   Ice Chips Ice chips: Within functional limits Presentation: Spoon   Thin Liquid Thin Liquid: Within functional limits Presentation: Cup;Self Fed;Straw    Nectar Thick Nectar Thick Liquid: Not tested   Honey Thick Honey Thick Liquid: Not tested   Puree Puree: Within functional limits Presentation: Self Fed;Spoon   Solid   GO   Solid: Within functional limits Presentation: Self Fed        Maxcine Hamaiewonsky, Orma Cheetham 10/18/2016,12:08 PM  Maxcine HamLaura Paiewonsky, M.A. CCC-SLP [phone]

## 2016-10-18 NOTE — Progress Notes (Signed)
[name] Federer is a 76 yo female [facility] for continued code stroke work up. Per daughter at bedside pt LKW at 2130, she walked into house at 2145 when Mrs.Barro stated "my right doesn't work anymore."  Pt transported for CTA, TPA started 0016. Pt to be admitted to neuro

## 2016-10-18 NOTE — ED Notes (Signed)
Pt arrives via [facility] from [facility] for continued code stroke workup; Amada JupiterKirkpatrick, MD and Verlon AuLeslie, Rapid response at bedside

## 2016-10-18 NOTE — Progress Notes (Addendum)
Neurology made aware of soft blood pressures. 1L NS bolus verbal order given, new SBP goal >100

## 2016-10-19 ENCOUNTER — Inpatient Hospital Stay (HOSPITAL_COMMUNITY): Payer: Medicare Other

## 2016-10-19 DIAGNOSIS — I360 Nonrheumatic tricuspid (valve) stenosis: Secondary | ICD-10-CM

## 2016-10-19 LAB — PROTIME-INR
INR: 1.68
Prothrombin Time: 20 seconds — ABNORMAL HIGH (ref 11.4–15.2)

## 2016-10-19 LAB — TROPONIN I
Troponin I: <0.03 ng/mL (ref <0.03)
Troponin I: <0.03 ng/mL (ref <0.03)

## 2016-10-19 LAB — VAS US CAROTID
LCCADDIAS: 18 cm/s
LCCAPDIAS: 16 cm/s
LCCAPSYS: 74 cm/s
LEFT ECA DIAS: -13 cm/s
LEFT VERTEBRAL DIAS: 15 cm/s
LICADDIAS: -38 cm/s
LICADSYS: -95 cm/s
Left CCA dist sys: 74 cm/s
Left ICA prox dias: -21 cm/s
Left ICA prox sys: -79 cm/s
RCCADSYS: -104 cm/s
RIGHT ECA DIAS: -13 cm/s
RIGHT VERTEBRAL DIAS: 13 cm/s
Right CCA prox dias: 16 cm/s
Right CCA prox sys: 63 cm/s

## 2016-10-19 LAB — GLUCOSE, CAPILLARY
GLUCOSE-CAPILLARY: 109 mg/dL — AB (ref 65–99)
GLUCOSE-CAPILLARY: 152 mg/dL — AB (ref 65–99)
Glucose-Capillary: 118 mg/dL — ABNORMAL HIGH (ref 65–99)
Glucose-Capillary: 116 mg/dL — ABNORMAL HIGH (ref 65–99)
Glucose-Capillary: 93 mg/dL (ref 65–99)

## 2016-10-19 MED ORDER — WARFARIN SODIUM 5 MG PO TABS: 5.0000 mg | ORAL_TABLET | Freq: Once | ORAL | 0 refills | Status: DC

## 2016-10-19 MED ORDER — ASPIRIN 81 MG PO TBEC: 81.0000 mg | DELAYED_RELEASE_TABLET | Freq: Every day | ORAL | 0 refills | Status: DC

## 2016-10-19 MED ORDER — PANTOPRAZOLE SODIUM 40 MG PO TBEC: 40.0000 mg | DELAYED_RELEASE_TABLET | Freq: Every day | ORAL | Status: DC

## 2016-10-19 MED ORDER — WARFARIN SODIUM 5 MG PO TABS
5.0000 mg | ORAL_TABLET | Freq: Once | ORAL | Status: DC
  Filled 2016-10-19: qty 1

## 2016-10-19 MED ORDER — ASPIRIN EC 81 MG PO TBEC
81.0000 mg | DELAYED_RELEASE_TABLET | Freq: Every day | ORAL | Status: DC
  Administered 2016-10-19: 81 mg via ORAL
  Filled 2016-10-19: qty 1

## 2016-10-19 MED ORDER — WARFARIN SODIUM 5 MG PO TABS: 5.0000 mg | ORAL_TABLET | Freq: Once | ORAL | Status: DC

## 2016-10-19 MED ORDER — WARFARIN - PHARMACIST DOSING INPATIENT
Freq: Every day | Status: DC
  Administered 2016-10-19: 1

## 2016-10-19 NOTE — Progress Notes (Signed)
STROKE TEAM PROGRESS NOTE   HISTORY OF PRESENT ILLNESS (per record) Alexa Fox is a 76 y.o. female with a past medical history of A. fib, diabetes, hypertension, stroke who presents with acute onset right-sided weakness and aphasia. She is from [place] and is currently visiting. Her daughter states that, the left around 9:30 on 10/18/2016, subsequently she came back in and found her to be aphasic. They brought her to the emergency department at [facility]. Prior to transfer here, she did have some improvement and was able to speak some on arrival to the ED at [facility]. She was able explain that she had not had any vision problems with past few days, having no symptoms prior to onset of these symptoms.  The risk and benefits of tPA administration were discussed with the two daughters presents, who consented to administer tPA. LKW: 9:30 PM on 10/18/2016   Patient was administered IV t-PA at 0016 on 10/18/2016. She was admitted to the neuro ICU for further evaluation and treatment.   SUBJECTIVE (INTERVAL HISTORY) Her two daughters are at the bedside.  The patient is awake, alert, and follows all commands appropriately.  Repeat CT head without hemorrhagic transformation. MRI shows no acute infarct  OBJECTIVE Temp:  [98.6 F (37 C)-100 F (37.8 C)] 98.7 F (37.1 C) (07/12 1200) Pulse Rate:  [57-84] 74 (07/12 1500) Cardiac Rhythm: Atrial fibrillation (07/12 0800) Resp:  [15-26] 22 (07/12 1500) BP: (85-111)/(38-64) 91/48 (07/12 1500) SpO2:  [93 %-100 %] 96 % (07/12 1500)  CBC:  Recent Labs Lab 10/17/16 2245 10/18/16 0428  WBC 9.0 9.7  NEUTROABS 5.8  --   HGB 13.3 12.5  HCT 39.3 38.6  MCV 92.9 93.5  PLT 185 140*    Basic Metabolic Panel:  Recent Labs Lab 10/17/16 2245 10/18/16 0428  NA 138 140  K 3.8 4.1  CL 102 109  CO2 28 20*  GLUCOSE 171* 126*  BUN 29* 24*  CREATININE 1.04* 0.92  CALCIUM 9.3 8.6*    Lipid Panel:    Component Value Date/Time   CHOL 157  10/18/2016 0428   TRIG 85 10/18/2016 0428   HDL 55 10/18/2016 0428   CHOLHDL 2.9 10/18/2016 0428   VLDL 17 10/18/2016 0428   LDLCALC 85 10/18/2016 0428   HgbA1c: No results found for: HGBA1C Urine Drug Screen:    Component Value Date/Time   LABOPIA NONE DETECTED 10/18/2016 0018   COCAINSCRNUR NONE DETECTED 10/18/2016 0018   LABBENZ NONE DETECTED 10/18/2016 0018   AMPHETMU NONE DETECTED 10/18/2016 0018   THCU NONE DETECTED 10/18/2016 0018   LABBARB NONE DETECTED 10/18/2016 0018    Alcohol Level     Component Value Date/Time   ETH <5 10/17/2016 2245    IMAGING  Ct Angio Head W and Wo Contrast 10/18/2016 IMPRESSION: 1. Negative CTA for emergent large vessel occlusion. 2. Minor atherosclerotic changes for patient age as above. No high-grade or flow-limiting stenosis.  Ct Head Wo Contrast 10/18/2016 IMPRESSION: 1. No acute intracranial hemorrhage or other abnormality identified status post tPA. 2. No other acute intracranial process identified. Previously questioned hypodensity at the left occipital pole is not seen on this exam. This was most likely artifactual on prior study. 3. Stable atrophy with scattered remote infarcts as above.   Ct Head Wo Contrast 10/17/2016 IMPRESSION: 1. Question subtle evolving hypodensity within the left occipital pole, suspicious for acute left PCA territory infarct. No acute intracranial hemorrhage. 2. ASPECTS is 10 3. Age-related cerebral atrophy with mild chronic  small vessel ischemic disease with scattered remote infarcts as above.   Mr Brain Wo Contrast 10/19/2016 IMPRESSION: 1. No acute intracranial infarct or other process identified. 2. Generalized age-related cerebral atrophy with mild chronic small vessel ischemic disease and scattered remote infarcts as above.   PHYSICAL EXAM Pleasant obese elderly Caucasian lady not in distress.  . Afebrile. Head is nontraumatic. Neck is supple without bruit.    Cardiac exam no murmur or gallop. Lungs are  clear to auscultation. Distal pulses are well felt. Neurological Exam ;  Awake  Alert oriented x 3. Normal speech and language.eye movements full without nystagmus.fundi were not visualized. Vision acuity and fields appear normal. Hearing is normal. Palatal movements are normal. Face symmetric. Tongue midline. Normal strength, tone, reflexes and coordination. Normal sensation. Gait deferred.  ASSESSMENT/PLAN Alexa Fox is a 76 y.o. female with history of A. fib, diabetes, hypertension, stroke who presents with acute onset right-sided weakness and aphasia. She was administered IV t-PA at 0016 on 10/18/2016.   Stroke: Subtle evolving hypodensity within the left occipital pole, suspicious for acute left PCA territory infarct in the setting of chronic small vessel disease and scattered chronic infarcts  Resultant  No deficits  CT head: no stroke  MRI head (post-tPA): no stroke  MRA head: not ordered  Carotid Doppler: B ICA 1-39% stenosis, VAs antegrade  2D Echo   pending   LDL 85  HgbA1c pending   SCDs for VTE prophylaxis  Diet heart healthy/carb modified Room service appropriate? Yes; Fluid consistency: Thin  warfarin daily prior to admission, now on aspirin 81 mg daily and warfarin daily  Patient counseled to be compliant with her antithrombotic medications  Ongoing aggressive stroke risk factor management  Therapy recommendations: Home health PT;Supervision/Assistance - 24 hour (however may progress and not need HHPT) Disposition:  home [employer]  Stable Permissive hypertension (OK if < 220/120) but gradually normalize in 5-7 days Long-term BP goal normotensive  Hyperlipidemia  Home meds:  simvastatin, resumed in hospital  LDL 85, goal < 70  Continue statin at discharge  Diabetes  HgbA1c pending goal < 7.0  Controlled  Other Stroke Risk Factors  Advanced age  Obesity, Body mass index is 31.94 kg/m., recommend weight loss, diet and exercise as  appropriate   Hx stroke/TIA  Other Active Problems  Subtherapeutic INR: resume coumadin, pharmacy to dose, aspirin bridge  Hospital day # 1  I have personally examined this patient, reviewed notes, independently viewed imaging studies, participated in medical decision making and plan of care.ROS completed by me personally and pertinent positives fully documented  I have made any additions or clarifications directly to the above note.  Plan discharge from today to her daughter's house. Resume warfarin  with aspirin bridge until and unless t upon her return. Long discussion with the patient and answered questions Delia HeadyPramod Devine Dant, MD Medical Director [facility] Pager: [phone] 10/19/2016 5:35 PM   To contact Stroke Continuity provider, please refer to [email or web address]. After hours, contact General Neurology

## 2016-10-19 NOTE — Progress Notes (Signed)
  Echocardiogram 2D Echocardiogram has been performed.  Crystal Scarberry G Kennis Buell 10/19/2016, 1:31 PM

## 2016-10-19 NOTE — Discharge Summary (Signed)
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 A-fib (HCC)   . Diabetes mellitus without complication (HCC)   . Hypertension   . Stroke [facility](HCC)    Past Surgical History:  Procedure Laterality Date  . ABDOMINAL HYSTERECTOMY    . CHOLECYSTECTOMY      Allergies as of 10/19/2016      Reactions   Eggs Or Egg-derived Products    Reaction unknown to patient/family           LABORATORY STUDIES CBC    Component Value Date/Time   WBC 9.7 10/18/2016 0428   RBC 4.13 10/18/2016 0428   HGB 12.5 10/18/2016 0428   HCT 38.6 10/18/2016 0428   PLT 140 (L) 10/18/2016 0428   MCV 93.5 10/18/2016 0428   MCH 30.3 10/18/2016 0428   MCHC 32.4 10/18/2016 0428   RDW 12.8 10/18/2016 0428   LYMPHSABS 2.3 10/17/2016 2245   MONOABS 0.8 10/17/2016 2245   EOSABS 0.1 10/17/2016 2245   BASOSABS 0.0 10/17/2016 2245   CMP    Component Value Date/Time   NA 140 10/18/2016 0428   K 4.1 10/18/2016 0428   CL 109 10/18/2016 0428   CO2 20 (L) 10/18/2016 0428   GLUCOSE 126 (H) 10/18/2016 0428   BUN 24 (H) 10/18/2016 0428   CREATININE 0.92 10/18/2016 0428   CALCIUM 8.6 (L) 10/18/2016 0428   PROT 7.1 10/17/2016 2245   ALBUMIN 4.0 10/17/2016 2245   AST 22 10/17/2016 2245   ALT 23 10/17/2016 2245   ALKPHOS 55 10/17/2016 2245   BILITOT 0.5 10/17/2016 2245   GFRNONAA 59 (L) 10/18/2016 0428   GFRAA >60 10/18/2016 0428   COAGS Lab Results  Component Value  Date   INR 1.68 10/19/2016   INR 1.55 10/17/2016   Lipid Panel    Component Value Date/Time   CHOL 157 10/18/2016 0428   TRIG 85 10/18/2016 0428   HDL 55 10/18/2016 0428   CHOLHDL 2.9 10/18/2016 0428   VLDL 17 10/18/2016 0428   LDLCALC 85 10/18/2016 0428   HgbA1C No results found for: HGBA1C Urinalysis    Component Value Date/Time   COLORURINE STRAW (A) 10/18/2016 0018   APPEARANCEUR CLEAR 10/18/2016 0018   LABSPEC 1.009 10/18/2016 0018   PHURINE 5.0 10/18/2016 0018   GLUCOSEU NEGATIVE 10/18/2016 0018   HGBUR SMALL (A) 10/18/2016 0018   BILIRUBINUR NEGATIVE 10/18/2016 0018   KETONESUR NEGATIVE 10/18/2016 0018   PROTEINUR NEGATIVE 10/18/2016 0018   NITRITE NEGATIVE 10/18/2016 0018   LEUKOCYTESUR NEGATIVE 10/18/2016 0018   Urine Drug Screen     Component Value Date/Time   LABOPIA NONE DETECTED 10/18/2016 0018   COCAINSCRNUR NONE DETECTED 10/18/2016 0018   LABBENZ NONE DETECTED 10/18/2016 0018   AMPHETMU NONE DETECTED 10/18/2016 0018   THCU NONE DETECTED 10/18/2016 0018   LABBARB NONE DETECTED 10/18/2016 0018    Alcohol Level    Component Value Date/Time  ETH <5 10/17/2016 2245     SIGNIFICANT DIAGNOSTIC STUDIES Ct Head Wo Contrast 10/18/2016 IMPRESSION: 1. No acute intracranial hemorrhage or other abnormality identified status post tPA. 2. No other acute intracranial process identified. Previously questioned hypodensity at the left occipital pole is not seen on this exam. This was most likely artifactual on prior study. 3. Stable atrophy with scattered remote infarcts as above.   Ct Head Wo Contrast 10/17/2016 IMPRESSION: 1. Question subtle evolving hypodensity within the left occipital pole, suspicious for acute left PCA territory infarct. No acute intracranial hemorrhage. 2. ASPECTS is 10 3. Age-related cerebral atrophy with mild chronic small vessel ischemic disease with scattered remote infarcts as above.  Ct Angio Neck W and Wo  Contrast 10/18/2016 IMPRESSION: 1. Negative CTA for emergent large vessel occlusion. 2. Minor atherosclerotic changes for patient age as above. No high-grade or flow-limiting stenosis.    HISTORY OF PRESENT ILLNESS Alexa Fox a 76 y.o.femalewith a past medical history of A. fib, diabetes, hypertension, and stroke who presents with acute onset right-sided weakness and aphasia. She is from [place] and is currently visiting. Her daughter states that , the left around 9:30, subsequently she came back in and found her to be aphasic. They brought her to the emergency department at [facility]. Prior to transfer here, she did have some improvement and was able to speak some on arrival to the ED at [facility]. She was able to tell me that she had not had any vision problems with past few days, having no symptoms prior to onset of these symptoms.  CT head 10/17/2016 with questionable L PCA infarct.   Neurohospitalist on call discussed IV TPA with her and her daughter and both agreed to proceed. LKW: 9:30 PM on 10/17/2016 tpa given?: Yes  Patient was administered IV t-PA at 0016 on 10/17/2016.  She was admitted to the neuro ICU for further evaluation and treatment.she showed quick clinical recovery of her aphasia and weakness and remained neurologically stable. Blood pressure was tightly controlled. Transthoracic echo was performed but results are pending at the time of discharge. Patient was from [place] and was visiting her daughter. She wanted to followup with her cardiologist the and discuss medication alternatives and warfarin was resumed and she had valvular A. Fib and aspirin was added as bridging his INR became therapeutic. She was advised to have INR checked in [place] at her local cardiologist office.   HOSPITAL COURSE Ms. Alexa Fox is a 76 y.o. female with history of f A. fib, diabetes, hypertension, and stroke who presents with acute onset right-sided weakness and aphasia. She  did received IV t-PA at 0016 on 10/17/2016.   Stroke: likely embolic in the setting of atrial fibrillation on coumadin with subtherapeutic INR  Resultant  No deficits  CT head:  Question subtle evolving hypodensity within the left occipital pole, suspicious for acute left PCA territory infarct  MRI head: IMPRESSION: 1. No acute intracranial infarct or other process identified. 2. Generalized age-related cerebral atrophy with mild chronic small vessel ischemic disease and scattered remote infarcts as above.  MRA head  not ordered  Carotid Doppler: B ICA 1-39% stenosis, VAs antegrade  2D Echo   pending   LDL 86  HgbA1c pending  SCDs for VTE prophylaxis  Diet heart healthy/carb modified Room service appropriate? Yes; Fluid consistency: Thin  warfarin daily prior to admission, now on No antithrombotic, received tPA at 0016 on 7/1-/2018  Patient counseled to be compliant with her antithrombotic medications  Ongoing aggressive stroke risk factor management  Therapy recommendations:  24 hour supervision/assistance  Disposition: pending  Hypotension  Unstable              Tmax 100.6              Fluid resuscitation: 2.5 L NS and 1 unit albumin              Long-term BP goal normotensive              Atrial flutter on EKG with RBBB in lead III and old anteroseptal infarct              Troponin negative              Echo pending  Hyperlipidemia  Home meds: simvastatin 20mg  PO daily, resumed in hospital  LDL 85 , goal < 70  Continue statin at discharge  Diabetes  HgbA1c  pending, goal < 7.0  Controlled  Other Stroke Risk Factors  Advanced age  Obesity, Body mass index is 31.94 kg/m., recommend weight loss, diet and exercise as appropriate   Hx stroke/TIA  Other Active Problems  Low grade temperature, Tmax 100.6  DISCHARGE EXAM Blood pressure (!) 95/52, pulse 79, temperature 98.9 F (37.2 C), temperature source Oral, resp. rate 18, height 5'  2" (1.575 m), weight 79.2 kg (174 lb 9.7 oz), SpO2 97 %. Elderly lady not in distress. . Afebrile. Head is nontraumatic. Neck is supple without bruit.    Cardiac exam no murmur or gallop. Lungs are clear to auscultation. Distal pulses are well felt.Pulse irregular atrial Fib-flutter Neurological Exam  Awake  Alert doriented x 3. Normal speech  And language . Follows only simple midline and one-step commands. eye movements full without nystagmus.fundi were not visualized. Vision acuity and fields appear normal. Hearing is normal. Palatal movements are normal. Face symmetric. Tongue midline. Normal strength, tone, reflexes and coordination. Normal sensation. Gait deferred.   Discharge Diet   Diet heart healthy/carb modified Room service appropriate? Yes; Fluid consistency: Thin liquids  DISCHARGE PLAN  Disposition:  Discharge to home  aspirin 81 mg daily and warfarin daily for secondary stroke prevention.tell INR is therapeutic then stop her aspirin  Ongoing risk factor control by Primary Care Physician at time of discharge  Follow-up System, Pcp Not In in 2 weeks.  Follow-up with a Neurologist in [place] in 6 weeks, call office to schedule an appointment.  40 minutes were spent preparing discharge.  I have personally examined this patient, reviewed notes, independently viewed imaging studies, participated in medical decision making and plan of care.ROS completed by me personally and pertinent positives fully documented  I have made any additions or clarifications directly to the above note. Agree with note above.    Delia Heady, MD Medical Director [facility] Stroke [facility] Pager: [phone] 10/19/2016 5:32 PM

## 2016-10-19 NOTE — Progress Notes (Signed)
ANTICOAGULATION CONSULT NOTE - Initial Consult  Pharmacy Consult for Warfarin Indication: atrial fibrillation  Allergies  Allergen Reactions  . Eggs Or Egg-Derived Products     Reaction unknown to patient/family    Patient Measurements: Height: 5\' 2"  (157.5 cm) Weight: 174 lb 9.7 oz (79.2 kg) IBW/kg (Calculated) : 50.1   Vital Signs: Temp: 98.6 F (37 C) (07/12 0800) Temp Source: Oral (07/12 0800) BP: 99/63 (07/12 1000) Pulse Rate: 84 (07/12 1000)  Labs:  Recent Labs  10/17/16 2245 10/18/16 0428 10/19/16 0029 10/19/16 0635  HGB 13.3 12.5  --   --   HCT 39.3 38.6  --   --   PLT 185 140*  --   --   APTT 33  --   --   --   LABPROT 18.7*  --   --   --   INR 1.55  --   --   --   CREATININE 1.04* 0.92  --   --   TROPONINI <0.03  --  <0.03 <0.03    Estimated Creatinine Clearance: 49.9 mL/min (by C-G formula based on SCr of 0.92 mg/dL).   Medical History: Past Medical History:  Diagnosis Date  . A-fib (HCC)   . Diabetes mellitus without complication (HCC)   . Hypertension   . Stroke [facility](HCC)     Assessment: Alexa Fox is a 76 yo F with afib who presented on 10/17/16 with an acute ischemic stroke. Patient was on a regimen of warfarin 2.5 mg QD with a last dose on 10/17/16 at 2100 prior to discontinuation of anticoagulation due to TPA therapy. Pharmacy consulted to resume warfarin on 10/18/16. INR 1.55 on 10/17/16. STAT INR pending. Hgb WNL, Plt dropped to 140. No bleeding documented.   Goal of Therapy:  INR 2-3 Monitor platelets by anticoagulation protocol: Yes   Plan:  Warfarin 5 mg PO x 1 -> then likely resume home dose (2.5 mg daily) after 10/18/16 F/u STAT INR 10/18/16 to confirm dose -> then daily INR, monitor diet changes, s/sx of bleeding, and CBC  Alexa Fox  Pharmacy Student [facility] 10/19/2016,11:44 AM

## 2016-10-19 NOTE — Progress Notes (Signed)
Physical Therapy Treatment Patient Details Name: Alexa Fox MRN: [id number] DOB: 1941-01-20 Today's Date: 10/19/2016    History of Present Illness Alexa Fox a 76 y.o.femalewith a past medical history of A. fib, diabetes, hypertension, stroke who presents with acute onset right-sided weakness and aphasia. tpA given.    PT Comments    Pt progressing towards physical therapy goals. Was able to ambulate in hall fairly well however continues to demonstrate decreased awareness of environment and instability with dynamic movement. Will continue to follow and progress as able per POC.    Follow Up Recommendations  Home health PT;Supervision/Assistance - 24 hour (however may progress and not need HHPT)     Equipment Recommendations  Cane    Recommendations for Other Services       Precautions / Restrictions Precautions Precautions: Fall Restrictions Weight Bearing Restrictions: No    Mobility  Bed Mobility Overal bed mobility: Needs Assistance Bed Mobility: Supine to Sit     Supine to sit: Min guard     General bed mobility comments: labored effort but able to complete without physical assist,  Transfers Overall transfer level: Needs assistance Equipment used: None Transfers: Sit to/from Stand Sit to Stand: Min guard         General transfer comment: Close guard for safety as pt powered up to full standing position. VC's for hand placement on seated surface for safety.   Ambulation/Gait Ambulation/Gait assistance: Min guard Ambulation Distance (Feet): 175 Feet Assistive device: None Gait Pattern/deviations: Step-through pattern;Decreased stride length;Shuffle Gait velocity: Decreased Gait velocity interpretation: Below normal speed for age/gender General Gait Details: Mildly unsteady with occasional stagger laterally. Pt did not seem aware or concerned with LOB. When brought to her attention, pt states "Oh that's nothing new."   Stairs             Wheelchair Mobility    Modified Rankin (Stroke Patients Only) Modified Rankin (Stroke Patients Only) Pre-Morbid Rankin Score: No significant disability Modified Rankin: Moderate disability     Balance Overall balance assessment: Needs assistance Sitting-balance support: Feet supported;No upper extremity supported Sitting balance-Leahy Scale: Good     Standing balance support: No upper extremity supported;During functional activity Standing balance-Leahy Scale: Fair                              Cognition Arousal/Alertness: Awake/alert (but sleepy from not sleeping all night) Behavior During Therapy: Flat affect;Impulsive Overall Cognitive Status: Impaired/Different from baseline Area of Impairment: Problem solving                             Problem Solving: Slow processing;Requires verbal cues;Requires tactile cues General Comments: pt with no consideration for multiple lines and IV      Exercises      General Comments        Pertinent Vitals/Pain Pain Assessment: No/denies pain    Home Living                      Prior Function            PT Goals (current goals can now be found in the care plan section) Acute Rehab PT Goals Patient Stated Goal: didn't state PT Goal Formulation: With patient/family Time For Goal Achievement: 11/01/16 Potential to Achieve Goals: Good Progress towards PT goals: Progressing toward goals    Frequency    Min 4X/week  PT Plan Current plan remains appropriate    Co-evaluation              AM-PAC PT "6 Clicks" Daily Activity  Outcome Measure  Difficulty turning over in bed (including adjusting bedclothes, sheets and blankets)?: A Little Difficulty moving from lying on back to sitting on the side of the bed? : A Little Difficulty sitting down on and standing up from a chair with arms (e.g., wheelchair, bedside commode, etc,.)?: A Little Help needed moving to and from a bed  to chair (including a wheelchair)?: A Little Help needed walking in hospital room?: A Little Help needed climbing 3-5 steps with a railing? : A Lot 6 Click Score: 17    End of Session Equipment Utilized During Treatment: Gait belt Activity Tolerance: Patient tolerated treatment well Patient left: in bed;with call bell/phone within reach;with bed alarm set;with family/visitor present Nurse Communication: Mobility status PT Visit Diagnosis: Unsteadiness on feet (R26.81)     Time: [id number] PT Time Calculation (min) (ACUTE ONLY): 18 min  Charges:  $Gait Training: 8-22 mins                    G Codes:       Alexa SlipperLaura Alcie Fox, PT, DPT Acute Rehabilitation Services Pager: [phone]    Alexa PearsonLaura D Stormy Fox 10/19/2016, 3:21 PM

## 2016-10-19 NOTE — Discharge Instructions (Signed)
Please follow up with a Neurologist in [place] in 6 weeks.

## 2016-10-20 ENCOUNTER — Emergency Department (HOSPITAL_COMMUNITY): Payer: Medicare Other

## 2016-10-20 ENCOUNTER — Inpatient Hospital Stay (HOSPITAL_COMMUNITY): Payer: Medicare Other

## 2016-10-20 ENCOUNTER — Encounter (HOSPITAL_COMMUNITY): Payer: Self-pay | Admitting: Emergency Medicine

## 2016-10-20 ENCOUNTER — Inpatient Hospital Stay (HOSPITAL_COMMUNITY)
Admission: EM | Admit: 2016-10-20 | Discharge: 2016-10-23 | DRG: 065 | Disposition: A | Discharge status: Home Health | Payer: Medicare Other | Attending: Internal Medicine | Admitting: Internal Medicine

## 2016-10-20 DIAGNOSIS — D649 Anemia, unspecified: Secondary | ICD-10-CM | POA: Diagnosis present

## 2016-10-20 DIAGNOSIS — R791 Abnormal coagulation profile: Secondary | ICD-10-CM | POA: Diagnosis present

## 2016-10-20 DIAGNOSIS — E119 Type 2 diabetes mellitus without complications: Secondary | ICD-10-CM

## 2016-10-20 DIAGNOSIS — I10 Essential (primary) hypertension: Secondary | ICD-10-CM | POA: Diagnosis present

## 2016-10-20 DIAGNOSIS — Z8673 Personal history of transient ischemic attack (TIA), and cerebral infarction without residual deficits: Secondary | ICD-10-CM

## 2016-10-20 DIAGNOSIS — Z91012 Allergy to eggs: Secondary | ICD-10-CM

## 2016-10-20 DIAGNOSIS — R29709 NIHSS score 9: Secondary | ICD-10-CM | POA: Diagnosis present

## 2016-10-20 DIAGNOSIS — Z8249 Family history of ischemic heart disease and other diseases of the circulatory system: Secondary | ICD-10-CM

## 2016-10-20 DIAGNOSIS — I4510 Unspecified right bundle-branch block: Secondary | ICD-10-CM | POA: Diagnosis present

## 2016-10-20 DIAGNOSIS — I482 Chronic atrial fibrillation: Secondary | ICD-10-CM | POA: Diagnosis not present

## 2016-10-20 DIAGNOSIS — I050 Rheumatic mitral stenosis: Secondary | ICD-10-CM | POA: Diagnosis present

## 2016-10-20 DIAGNOSIS — Z6834 Body mass index (BMI) 34.0-34.9, adult: Secondary | ICD-10-CM | POA: Diagnosis not present

## 2016-10-20 DIAGNOSIS — Z90710 Acquired absence of both cervix and uterus: Secondary | ICD-10-CM

## 2016-10-20 DIAGNOSIS — I639 Cerebral infarction, unspecified: Secondary | ICD-10-CM

## 2016-10-20 DIAGNOSIS — R1310 Dysphagia, unspecified: Secondary | ICD-10-CM | POA: Diagnosis present

## 2016-10-20 DIAGNOSIS — D696 Thrombocytopenia, unspecified: Secondary | ICD-10-CM

## 2016-10-20 DIAGNOSIS — E669 Obesity, unspecified: Secondary | ICD-10-CM | POA: Diagnosis present

## 2016-10-20 DIAGNOSIS — Z7984 Long term (current) use of oral hypoglycemic drugs: Secondary | ICD-10-CM

## 2016-10-20 DIAGNOSIS — G8191 Hemiplegia, unspecified affecting right dominant side: Secondary | ICD-10-CM | POA: Diagnosis present

## 2016-10-20 DIAGNOSIS — E785 Hyperlipidemia, unspecified: Secondary | ICD-10-CM | POA: Diagnosis present

## 2016-10-20 DIAGNOSIS — I63412 Cerebral infarction due to embolism of left middle cerebral artery: Principal | ICD-10-CM | POA: Diagnosis present

## 2016-10-20 DIAGNOSIS — Z7901 Long term (current) use of anticoagulants: Secondary | ICD-10-CM

## 2016-10-20 DIAGNOSIS — R4701 Aphasia: Secondary | ICD-10-CM | POA: Diagnosis present

## 2016-10-20 DIAGNOSIS — E1159 Type 2 diabetes mellitus with other circulatory complications: Secondary | ICD-10-CM

## 2016-10-20 DIAGNOSIS — Z79899 Other long term (current) drug therapy: Secondary | ICD-10-CM

## 2016-10-20 DIAGNOSIS — R4781 Slurred speech: Secondary | ICD-10-CM | POA: Diagnosis present

## 2016-10-20 DIAGNOSIS — I4891 Unspecified atrial fibrillation: Secondary | ICD-10-CM

## 2016-10-20 DIAGNOSIS — R471 Dysarthria and anarthria: Secondary | ICD-10-CM

## 2016-10-20 DIAGNOSIS — R29810 Facial weakness: Secondary | ICD-10-CM | POA: Diagnosis present

## 2016-10-20 DIAGNOSIS — Z7982 Long term (current) use of aspirin: Secondary | ICD-10-CM

## 2016-10-20 DIAGNOSIS — Z833 Family history of diabetes mellitus: Secondary | ICD-10-CM

## 2016-10-20 HISTORY — DX: Transient cerebral ischemic attack, unspecified: G45.9

## 2016-10-20 LAB — COMPREHENSIVE METABOLIC PANEL
ALBUMIN: 3.6 g/dL (ref 3.5–5.0)
ALK PHOS: 43 U/L (ref 38–126)
ALT: 16 U/L (ref 14–54)
ANION GAP: 3 — AB (ref 5–15)
AST: 17 U/L (ref 15–41)
BILIRUBIN TOTAL: 1.2 mg/dL (ref 0.3–1.2)
BUN: 9 mg/dL (ref 6–20)
CALCIUM: 8.7 mg/dL — AB (ref 8.9–10.3)
CO2: 22 mmol/L (ref 22–32)
Chloride: 111 mmol/L (ref 101–111)
Creatinine, Ser: 0.81 mg/dL (ref 0.44–1.00)
GFR calc non Af Amer: >60 mL/min (ref >60)
GLUCOSE: 139 mg/dL — AB (ref 65–99)
POTASSIUM: 4.3 mmol/L (ref 3.5–5.1)
Sodium: 136 mmol/L (ref 135–145)
TOTAL PROTEIN: 5.7 g/dL — AB (ref 6.5–8.1)

## 2016-10-20 LAB — DIFFERENTIAL
Basophils Absolute: 0 10*3/uL (ref 0.0–0.1)
Basophils Relative: 0 %
EOS ABS: 0.1 10*3/uL (ref 0.0–0.7)
EOS PCT: 1 %
LYMPHS ABS: 1.4 10*3/uL (ref 0.7–4.0)
Lymphocytes Relative: 13 %
MONO ABS: 0.5 10*3/uL (ref 0.1–1.0)
Monocytes Relative: 5 %
Neutro Abs: 8.5 10*3/uL — ABNORMAL HIGH (ref 1.7–7.7)
Neutrophils Relative %: 81 %

## 2016-10-20 LAB — RAPID URINE DRUG SCREEN, HOSP PERFORMED
Amphetamines: NOT DETECTED
Barbiturates: NOT DETECTED
Benzodiazepines: NOT DETECTED
COCAINE: NOT DETECTED
OPIATES: NOT DETECTED
TETRAHYDROCANNABINOL: NOT DETECTED

## 2016-10-20 LAB — I-STAT CHEM 8, ED
BUN: 10 mg/dL (ref 6–20)
CALCIUM ION: 1.17 mmol/L (ref 1.15–1.40)
Chloride: 107 mmol/L (ref 101–111)
Creatinine, Ser: 0.7 mg/dL (ref 0.44–1.00)
Glucose, Bld: 140 mg/dL — ABNORMAL HIGH (ref 65–99)
HEMATOCRIT: 35 % — AB (ref 36.0–46.0)
HEMOGLOBIN: 11.9 g/dL — AB (ref 12.0–15.0)
Potassium: 4.4 mmol/L (ref 3.5–5.1)
SODIUM: 142 mmol/L (ref 135–145)
TCO2: 21 mmol/L (ref 0–100)

## 2016-10-20 LAB — URINALYSIS, ROUTINE W REFLEX MICROSCOPIC
BACTERIA UA: NONE SEEN
BILIRUBIN URINE: NEGATIVE
Glucose, UA: NEGATIVE mg/dL
KETONES UR: NEGATIVE mg/dL
NITRITE: NEGATIVE
PROTEIN: NEGATIVE mg/dL
Specific Gravity, Urine: 1.017 (ref 1.005–1.030)
pH: 5 (ref 5.0–8.0)

## 2016-10-20 LAB — LIPID PANEL
CHOL/HDL RATIO: 2.7 ratio
Cholesterol: 140 mg/dL (ref 0–200)
HDL: 52 mg/dL (ref >40)
LDL CALC: 72 mg/dL (ref 0–99)
Triglycerides: 80 mg/dL (ref <150)
VLDL: 16 mg/dL (ref 0–40)

## 2016-10-20 LAB — IRON AND TIBC
Iron: 28 ug/dL (ref 28–170)
Saturation Ratios: 10 % — ABNORMAL LOW (ref 10.4–31.8)
TIBC: 267 ug/dL (ref 250–450)
UIBC: 239 ug/dL

## 2016-10-20 LAB — I-STAT TROPONIN, ED: Troponin i, poc: 0.01 ng/mL (ref 0.00–0.08)

## 2016-10-20 LAB — TROPONIN I: Troponin I: <0.03 ng/mL (ref <0.03)

## 2016-10-20 LAB — CBC
HCT: 35.1 % — ABNORMAL LOW (ref 36.0–46.0)
Hemoglobin: 11.6 g/dL — ABNORMAL LOW (ref 12.0–15.0)
MCH: 31.1 pg (ref 26.0–34.0)
MCHC: 33 g/dL (ref 30.0–36.0)
MCV: 94.1 fL (ref 78.0–100.0)
PLATELETS: 118 10*3/uL — AB (ref 150–400)
RBC: 3.73 MIL/uL — ABNORMAL LOW (ref 3.87–5.11)
RDW: 13 % (ref 11.5–15.5)
WBC: 10.4 10*3/uL (ref 4.0–10.5)

## 2016-10-20 LAB — PROTIME-INR
INR: 1.46
PROTHROMBIN TIME: 17.8 s — AB (ref 11.4–15.2)

## 2016-10-20 LAB — BRAIN NATRIURETIC PEPTIDE: B NATRIURETIC PEPTIDE 5: 511.1 pg/mL — AB (ref 0.0–100.0)

## 2016-10-20 LAB — ETHANOL: Alcohol, Ethyl (B): <5 mg/dL (ref <5)

## 2016-10-20 LAB — HEMOGLOBIN A1C
HEMOGLOBIN A1C: 5.9 % — AB (ref 4.8–5.6)
Mean Plasma Glucose: 123 mg/dL

## 2016-10-20 LAB — CBG MONITORING, ED
GLUCOSE-CAPILLARY: 127 mg/dL — AB (ref 65–99)
Glucose-Capillary: 121 mg/dL — ABNORMAL HIGH (ref 65–99)
Glucose-Capillary: 100 mg/dL — ABNORMAL HIGH (ref 65–99)

## 2016-10-20 LAB — GLUCOSE, CAPILLARY
GLUCOSE-CAPILLARY: 90 mg/dL (ref 65–99)
Glucose-Capillary: 104 mg/dL — ABNORMAL HIGH (ref 65–99)

## 2016-10-20 LAB — FOLATE: Folate: 17.8 ng/mL (ref >5.9)

## 2016-10-20 LAB — RETICULOCYTES
RBC.: 3.86 MIL/uL — AB (ref 3.87–5.11)
RETIC COUNT ABSOLUTE: 50.2 10*3/uL (ref 19.0–186.0)
RETIC CT PCT: 1.3 % (ref 0.4–3.1)

## 2016-10-20 LAB — FERRITIN: FERRITIN: 73 ng/mL (ref 11–307)

## 2016-10-20 LAB — APTT: aPTT: 34 seconds (ref 24–36)

## 2016-10-20 LAB — VITAMIN B12: Vitamin B-12: 255 pg/mL (ref 180–914)

## 2016-10-20 LAB — MRSA PCR SCREENING: MRSA BY PCR: NEGATIVE

## 2016-10-20 LAB — POC OCCULT BLOOD, ED: Fecal Occult Bld: NEGATIVE

## 2016-10-20 MED ORDER — HEPARIN (PORCINE) IN NACL 100-0.45 UNIT/ML-% IJ SOLN
950.0000 [IU]/h | INTRAMUSCULAR | Status: DC
  Administered 2016-10-20: 950 [IU]/h via INTRAVENOUS
  Filled 2016-10-20: qty 250

## 2016-10-20 MED ORDER — ONDANSETRON HCL 4 MG/2ML IJ SOLN
INTRAMUSCULAR | Status: AC
  Filled 2016-10-20: qty 2

## 2016-10-20 MED ORDER — FUROSEMIDE 10 MG/ML IJ SOLN
40.0000 mg | Freq: Once | INTRAMUSCULAR | Status: AC
  Administered 2016-10-20: 40 mg via INTRAVENOUS
  Filled 2016-10-20: qty 4

## 2016-10-20 MED ORDER — ACETAMINOPHEN 325 MG PO TABS
650.0000 mg | ORAL_TABLET | ORAL | Status: DC | PRN
  Filled 2016-10-20: qty 2

## 2016-10-20 MED ORDER — STROKE: EARLY STAGES OF RECOVERY BOOK
Freq: Once | Status: DC
  Filled 2016-10-20: qty 1

## 2016-10-20 MED ORDER — FUROSEMIDE 10 MG/ML IJ SOLN: 40.0000 mg | Freq: Two times a day (BID) | INTRAMUSCULAR | Status: DC

## 2016-10-20 MED ORDER — DOCUSATE SODIUM 100 MG PO CAPS: 100.0000 mg | ORAL_CAPSULE | Freq: Every day | ORAL | Status: DC

## 2016-10-20 MED ORDER — SODIUM CHLORIDE 0.9 % IV SOLN
INTRAVENOUS | Status: DC
  Administered 2016-10-20 – 2016-10-21 (×3): via INTRAVENOUS

## 2016-10-20 MED ORDER — HYDRALAZINE HCL 20 MG/ML IJ SOLN: 5.0000 mg | Freq: Three times a day (TID) | INTRAMUSCULAR | Status: DC | PRN

## 2016-10-20 MED ORDER — SODIUM CHLORIDE 0.9 % IV BOLUS (SEPSIS)
500.0000 mL | Freq: Once | INTRAVENOUS | Status: AC
  Administered 2016-10-20: 500 mL via INTRAVENOUS

## 2016-10-20 MED ORDER — ATORVASTATIN CALCIUM 40 MG PO TABS
40.0000 mg | ORAL_TABLET | Freq: Every day | ORAL | Status: DC
Start: 2016-10-20 — End: 2016-10-23
  Administered 2016-10-20 – 2016-10-22 (×3): 40 mg via ORAL
  Filled 2016-10-20 (×3): qty 1

## 2016-10-20 MED ORDER — ASPIRIN EC 325 MG PO TBEC
325.0000 mg | DELAYED_RELEASE_TABLET | Freq: Every day | ORAL | Status: DC
  Administered 2016-10-20 – 2016-10-21 (×2): 325 mg via ORAL
  Filled 2016-10-20 (×2): qty 1

## 2016-10-20 MED ORDER — DILTIAZEM LOAD VIA INFUSION
10.0000 mg | Freq: Once | INTRAVENOUS | Status: AC
  Administered 2016-10-20: 10 mg via INTRAVENOUS
  Filled 2016-10-20: qty 10

## 2016-10-20 MED ORDER — SODIUM CHLORIDE 0.9 % IV SOLN
INTRAVENOUS | Status: DC
  Administered 2016-10-20: 08:00:00 via INTRAVENOUS

## 2016-10-20 MED ORDER — ENSURE ENLIVE PO LIQD
237.0000 mL | Freq: Two times a day (BID) | ORAL | Status: DC
  Administered 2016-10-21 – 2016-10-23 (×4): 237 mL via ORAL

## 2016-10-20 MED ORDER — DILTIAZEM HCL 100 MG IV SOLR
5.0000 mg/h | INTRAVENOUS | Status: DC
  Administered 2016-10-20: 5 mg/h via INTRAVENOUS
  Filled 2016-10-20: qty 100

## 2016-10-20 MED ORDER — FUROSEMIDE 10 MG/ML IJ SOLN
20.0000 mg | Freq: Every day | INTRAMUSCULAR | Status: DC
  Administered 2016-10-21: 20 mg via INTRAVENOUS
  Filled 2016-10-20: qty 2

## 2016-10-20 MED ORDER — LORAZEPAM 2 MG/ML IJ SOLN: 1.0000 mg | INTRAMUSCULAR | Status: DC | PRN

## 2016-10-20 MED ORDER — IOPAMIDOL (ISOVUE-370) INJECTION 76%
INTRAVENOUS | Status: AC
  Administered 2016-10-20: 50 mL
  Filled 2016-10-20: qty 50

## 2016-10-20 MED ORDER — ACETAMINOPHEN 650 MG RE SUPP: 650.0000 mg | RECTAL | Status: DC | PRN

## 2016-10-20 MED ORDER — ONDANSETRON HCL 4 MG/2ML IJ SOLN
4.0000 mg | Freq: Once | INTRAMUSCULAR | Status: AC
  Administered 2016-10-20: 4 mg via INTRAVENOUS

## 2016-10-20 MED ORDER — ACETAMINOPHEN 160 MG/5ML PO SOLN: 650.0000 mg | ORAL | Status: DC | PRN

## 2016-10-20 MED ORDER — INSULIN ASPART 100 UNIT/ML ~~LOC~~ SOLN
0.0000 [IU] | Freq: Three times a day (TID) | SUBCUTANEOUS | Status: DC
Start: 2016-10-20 — End: 2016-10-23
  Administered 2016-10-20 – 2016-10-21 (×3): 1 [IU] via SUBCUTANEOUS
  Administered 2016-10-22: 2 [IU] via SUBCUTANEOUS
  Administered 2016-10-22: 1 [IU] via SUBCUTANEOUS
  Administered 2016-10-23: 2 [IU] via SUBCUTANEOUS
  Filled 2016-10-20: qty 1

## 2016-10-20 MED ORDER — DOCUSATE SODIUM 50 MG/5ML PO LIQD
100.0000 mg | Freq: Every day | ORAL | Status: DC
  Administered 2016-10-20 – 2016-10-22 (×3): 100 mg via ORAL
  Filled 2016-10-20 (×3): qty 10

## 2016-10-20 MED ORDER — PANTOPRAZOLE SODIUM 40 MG IV SOLR
40.0000 mg | Freq: Every day | INTRAVENOUS | Status: DC
  Administered 2016-10-20 – 2016-10-21 (×2): 40 mg via INTRAVENOUS
  Filled 2016-10-20 (×2): qty 40

## 2016-10-20 NOTE — Consult Note (Signed)
Cardiology Consultation:   Patient ID: Cosima Prentiss; [id number]; 1940/12/16   Admit date: 10/20/2016 Date of Consult: 10/20/2016  Primary Care Provider: System, Pcp Not In Primary Cardiologist: in [place]    Patient Profile:   Annaly Skop is a 76 y.o. female with a hx of longstanding AFib (perhaps for 15 years), DM, HTN, Rheumatic fever, stroke, and dementia  who is being seen today for the evaluation of anticoagulation options/recommendations at the request of Dr. Pearlean Brownie.  History of Present Illness:   Ms. Takacs was initially admitted 10/18/16 with acute stroke w/symptoms of aphasia and R hemiparesis, she was in AFib with known hx, INR was subtherapeutic at 1.55 and treated with TPA, discharged 10/18/16, neurology noting Subtle evolving hypodensity within the left occipital pole, suspicious for acute left PCA territory infarct in the setting of chronic small vessel disease and scattered chronic infarcts.  She was discharged 10/18/16 on warfarin with ASA bridge and INR at 1.68.  She was brought back to the hospital via EMS today with recurrent aphasia and RUE weakness, INR 1.46   LABS  K+ 4.4 BUN/Creat 10/0.70 Trop I <0.03 H/H11.9/35.0 WBC 10.4 plts 118 BNP 511  Daughters at bedside report recent fluctuations/trouble with INR control after a course of antibiotics, and historically when needs have arrisen for discontinuation (surgeries) getting her stable again has been difficult.  The patient has no active physical complaints, remains with right sided weakness and difficult speech   Past Medical History:  Diagnosis Date  . A-fib (HCC)   . Diabetes mellitus without complication (HCC)   . Hypertension   . Stroke [facility])     Past Surgical History:  Procedure Laterality Date  . ABDOMINAL HYSTERECTOMY    . CHOLECYSTECTOMY       Inpatient Medications: Scheduled Meds: .  stroke: mapping our early stages of recovery book   Does not apply Once  . furosemide  20 mg Intravenous  Daily  . insulin aspart  0-9 Units Subcutaneous TID WC   Continuous Infusions: . sodium chloride 110 mL/hr at 10/20/16 1405   PRN Meds: acetaminophen **OR** acetaminophen (TYLENOL) oral liquid 160 mg/5 mL **OR** acetaminophen, hydrALAZINE, LORazepam  Allergies:    Allergies  Allergen Reactions  . Eggs Or Egg-Derived Products     Reaction unknown to patient/family    Social History:   Social History   Social History  . Marital status: Divorced    Spouse name: N/A  . Number of children: N/A  . Years of education: N/A   Occupational History  . Not on file.   Social History Main Topics  . Smoking status: Never Smoker  . Smokeless tobacco: Never Used  . Alcohol use No  . Drug use: No  . Sexual activity: Not on file   Other Topics Concern  . Not on file   Social History Narrative  . No narrative on file    Family History:   The patient's family history includes Cancer in her sister; Congestive Heart Failure in her sister; Diabetes in her brother and sister; Non-Hodgkin's lymphoma in her mother.  ROS:  Please see the history of present illness.  ROS  All other ROS reviewed and negative.     Physical Exam/Data:   Vitals:   10/20/16 1430 10/20/16 1445 10/20/16 1500 10/20/16 1530  BP: 116/86 111/68 122/66 121/61  Pulse: 79 84 79 81  Resp: (!) 23 (!) 24 (!) 24 (!) 24  Temp:      TempSrc:  SpO2: 99% 98% 97% 96%  Weight:      Height:        Intake/Output Summary (Last 24 hours) at 10/20/16 1549 Last data filed at 10/20/16 1200  Gross per 24 hour  Intake              500 ml  Output             2225 ml  Net            -1725 ml   Filed Weights   10/20/16 0457  Weight: 187 lb 2.7 oz (84.9 kg)   Body mass index is 34.23 kg/m.  General:  Well nourished, well developed, in no acute distress HEENT: normal Lymph: no adenopathy Neck: no JVD Endocrine:  No thryomegaly Vascular: No carotid bruits; FA pulses 2+ bilaterally without bruits  Cardiac:  iRRR; no  murmur, soft SM Lungs:  CTA b/l, no wheezing, rhonchi or rales  Abd: soft, nontender, no hepatomegaly  Ext: no edema Musculoskeletal:  No deformities,  Skin: warm and dry  Neuro:  Slurred speech, R weakness  Psych:  Difficult to assess  EKG:  The EKG was personally reviewed and demonstrates:   AFib today 132bpm, QRS 71ms 10/18/16: AFib 65bpm, QRS 68ms, QTc Telemetry:  Telemetry was personally reviewed and demonstrates:  AFi b currently 80's  Relevant CV Studies:  10/19/16: TTE Study Conclusions - Left ventricle: The cavity size was normal. Wall thickness was   normal. Systolic function was normal. The estimated ejection   fraction was in the range of 55% to 60%. - Aortic valve: AV is thickened, calcified with mildly restricted   motion Peak and mean gradients through the valve are 12 and 6 mm   Hg respectively consistent with mild AS. There was mild   regurgitation. Valve area (VTI): 1.98 cm^2. Valve area (Vmax):   1.9 cm^2. Valve area (Vmean): 1.97 cm^2. - Mitral valve: MV is thickened with some restricted motion. Peak   and mean gradients through the valve are 12 and 6 mm Hg   respectively MVA by Pt1/2 is 1.44 cm2 consistent with moderate   mitral stenosis. - Left atrium: The atrium was severely dilated. 50mm - Right atrium: The atrium was mildly dilated. - Tricuspid valve: There was moderate regurgitation. - Pulmonary arteries: PA peak pressure: 49 mm Hg (S).  Laboratory Data:  Chemistry  Recent Labs Lab 10/17/16 2245 10/18/16 0428 10/20/16 0441 10/20/16 0452  NA 138 140 136 142  K 3.8 4.1 4.3 4.4  CL 102 109 111 107  CO2 28 20* 22  --   GLUCOSE 171* 126* 139* 140*  BUN 29* 24* 9 10  CREATININE 1.04* 0.92 0.81 0.70  CALCIUM 9.3 8.6* 8.7*  --   GFRNONAA 50* 59* >60  --   GFRAA 59* >60 >60  --   ANIONGAP 8 11 3*  --      Recent Labs Lab 10/17/16 2245 10/20/16 0441  PROT 7.1 5.7*  ALBUMIN 4.0 3.6  AST 22 17  ALT 23 16  ALKPHOS 55 43  BILITOT 0.5  1.2   Hematology  Recent Labs Lab 10/17/16 2245 10/18/16 0428 10/20/16 0441 10/20/16 0452 10/20/16 0915  WBC 9.0 9.7 10.4  --   --   RBC 4.23 4.13 3.73*  --  3.86*  HGB 13.3 12.5 11.6* 11.9*  --   HCT 39.3 38.6 35.1* 35.0*  --   MCV 92.9 93.5 94.1  --   --   MCH  31.4 30.3 31.1  --   --   MCHC 33.8 32.4 33.0  --   --   RDW 12.4 12.8 13.0  --   --   PLT 185 140* 118*  --   --    Cardiac Enzymes  Recent Labs Lab 10/17/16 2245 10/19/16 0029 10/19/16 0635 10/19/16 1101 10/20/16 0731  TROPONINI <0.03 <0.03 <0.03 <0.03 <0.03     Recent Labs Lab 10/20/16 0450  TROPIPOC 0.01    BNP  Recent Labs Lab 10/20/16 0637  BNP 511.1*   Radiology/Studies:   Ct Angio Neck W Or Wo Contrast Result Date: 10/18/2016 CLINICAL DATA:  Initial evaluation for acute right arm weakness, status post tPA. EXAM: CT ANGIOGRAPHY HEAD AND NECK TECHNIQUE: Multidetector CT imaging of the head and neck was performed using the standard protocol during bolus administration of intravenous contrast. Multiplanar CT image reconstructions and MIPs were obtained to evaluate the vascular anatomy. Carotid stenosis measurements (when applicable) are obtained utilizing NASCET criteria, using the distal internal carotid diameter as the denominator. CONTRAST:  50 cc of Isovue 370. COMPARISON:  Prior CT from 10/17/2016. FINDINGS: CTA NECK FINDINGS Aortic arch: Visualized aortic arch of normal caliber with normal 3 vessel morphology. Minimal plaque about the origin of the great vessels without flow-limiting stenosis. Visualized subclavian artery is widely patent. Right carotid system: Right common and internal carotid artery's are widely patent without stenosis, dissection, or occlusion. No significant atheromatous narrowing about the right carotid bifurcation. Left carotid system: Left common carotid artery widely patent from its origin to the bifurcation. Mild a centric calcified plaque about the left carotid bifurcation  without stenosis. Left ICA widely patent distally to the skullbase without stenosis, dissection, or occlusion. Vertebral arteries: Both of the vertebral arteries arise from the subclavian arteries. Vertebral arteries widely patent within the neck without stenosis, dissection, or occlusion. Skeleton: No acute osseous abnormality. No worrisome lytic or blastic osseous lesions. Moderate degenerative spondylolysis present at C5-6 and C6-7. Multilevel facet arthrosis noted within the upper cervical spine. Other neck: Soft tissues of the neck demonstrate no acute abnormality. Salivary glands within normal limits. No adenopathy. Thyroid normal. Upper chest: Visualized upper chest within normal limits. Partially visualized lungs are clear. Review of the MIP images confirms the above findings CTA HEAD FINDINGS Anterior circulation: Petrous, cavernous, and supraclinoid segments of the internal carotid arteries are widely patent without flow-limiting stenosis. Minimal scattered plaque within the cavernous right ICA noted. ICA termini widely patent. A1 segments patent bilaterally. Anterior communicating artery normal. Anterior cerebral arteries patent to their distal aspects. M1 segments patent without stenosis or occlusion. No proximal M2 occlusion. Distal MCA branches well opacified and symmetric. Posterior circulation: Vertebral artery's widely patent to the vertebrobasilar junction. Left PICA patent. Dominant right AICA. Basilar artery widely patent. Superior cerebral arteries patent bilaterally. Both of the posterior cerebral artery supplied via the basilar and are widely patent to their distal aspects. Small left posterior communicating artery noted. Venous sinuses: Patent. Anatomic variants: No significant anatomic variant. No aneurysm or vascular malformation. Delayed phase: Not performed. Review of the MIP images confirms the above findings IMPRESSION: 1. Negative CTA for emergent large vessel occlusion. 2. Minor  atherosclerotic changes for patient age as above. No high-grade or flow-limiting stenosis. Critical Value/emergent results were discussed by telephone on 10/18/2016 at approximately 12:50 a.m. with Dr. Ritta Slot . Electronically Signed   By: Rise Mu M.D.   On: 10/18/2016 01:25   Mr Brain Wo Contrast Result Date: 10/20/2016 CLINICAL DATA:  Slurred speech and right arm weakness. EXAM: MRI HEAD WITHOUT CONTRAST TECHNIQUE: Multiplanar, multiecho pulse sequences of the brain and surrounding structures were obtained without intravenous contrast. COMPARISON:  Brain MRI from 2 days ago FINDINGS: Brain: Interval restricted diffusion in the left frontal parietal cortex, involving the post more than precentral gyrus. Remote small left parietal cortically based infarct. Remote small vessel infarcts in the right caudate nucleus and bilateral cerebellum. Microvascular ischemic gliosis is mild. Chronic blood products associated with remote left parietal infarct. No acute hemorrhage, hydrocephalus, or masslike findings. Vascular: Major flow voids are preserved. Preceding CTA earlier today. Skull and upper cervical spine: Negative for marrow lesion. Sinuses/Orbits: Bilateral cataract resection.  No acute finding. IMPRESSION: 1. Moderate acute infarct affecting the left post more than precentral gyri. 2. 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 Electronically Signed   By: Marnee SpringJonathon  Watts M.D.   On: 10/20/2016 10:07   Mr Brain Wo Contrast Result Date: 10/19/2016 CLINICAL DATA:  Initial evaluation for acute right-sided weakness with aphasia. EXAM: MRI HEAD WITHOUT CONTRAST TECHNIQUE: Multiplanar, multiecho pulse sequences of the brain and surrounding structures were obtained without intravenous contrast. COMPARISON:  Comparison made with prior CT from 10/17/2016 as well as earlier the same day. FINDINGS: Brain: Diffuse prominence of the CSF containing  spaces is compatible with generalized age-related cerebral atrophy. Remote lacunar infarcts present within the right basal ganglia. Multiple scatter remote bilateral cerebellar infarcts present as well. Remote cortical infarct with associated chronic blood products present within the left parietal lobe. Minimal for age chronic small vessel ischemic disease. No abnormal foci of restricted diffusion to suggest acute or subacute ischemia. Gray-white matter differentiation maintained. No evidence for acute intracranial hemorrhage. No mass lesion, midline shift or mass effect. No hydrocephalus. No extra-axial fluid collection. Major dural sinuses are grossly patent. Pituitary gland suprasellar region within normal limits. Vascular: Major intracranial vascular flow voids are maintained. Skull and upper cervical spine: Craniocervical junction within normal limits. Visualized upper cervical spine unremarkable. Bone marrow signal intensity within normal limits. No scalp soft tissue abnormality. Sinuses/Orbits: Globes and orbital soft tissues within normal limits. Patient status post lens extraction bilaterally. Paranasal sinuses are clear. No mastoid effusion. Inner ear structures normal. IMPRESSION: 1. No acute intracranial infarct or other process identified. 2. Generalized age-related cerebral atrophy with mild chronic small vessel ischemic disease and scattered remote infarcts as above. Electronically Signed   By: Rise MuBenjamin  McClintock M.D.   On: 10/19/2016 02:11   Dg Chest Portable 1 View Result Date: 10/20/2016 CLINICAL DATA:  76 year old female with shortness of breath and vomiting. Hypoxia. EXAM: PORTABLE CHEST 1 VIEW COMPARISON:  None FINDINGS: Mild cardiomegaly with mild vascular congestion. Minimal bibasilar atelectatic changes noted. No focal consolidation, pleural effusion, or pneumothorax. No acute osseous pathology. IMPRESSION: Mild cardiomegaly with vascular congestion.  No focal consolidation.  Electronically Signed   By: Elgie CollardArash  Radparvar M.D.   On: 10/20/2016 06:45   Ct Head Code Stroke W/o Cm Addendum Date: 10/20/2016   ADDENDUM REPORT: 10/20/2016 07:04 ADDENDUM: Results were discussed by telephone with Dr. Levander Campionob Yapundich at approximately 5:30 a.m. on 10/20/2016. Additionally, there is a dictation in the initial report. Dr. Olin PiaYapundich was paged at 5:12 a.m. on 10/20/2016 (not 08/20/2016). Electronically Signed   By: Rise MuBenjamin  McClintock M.D.   On: 10/20/2016 07:04  Result Date: 10/20/2016 CLINICAL DATA:  Code stroke. Initial evaluation for acute right-sided weakness, slurred speech. EXAM: CT HEAD WITHOUT CONTRAST TECHNIQUE: Contiguous axial images were  obtained from the base of the skull through the vertex without intravenous contrast. COMPARISON:  Prior MRI from 10/18/2016. FINDINGS: Brain: Stable atrophy with chronic small vessel ischemic disease. Remote lacunar infarct within the right basal ganglia. Scatter remote bilateral cerebellar infarcts, with additional remote posterior left MCA territory infarct. No acute intracranial hemorrhage. There is subtle loss of gray-white matter differentiation at the left frontal operculum and supra ganglionic posterior left frontal lobe (series 6, image 48), suspicious for evolving acute ischemic left MCA territory infarct. Left insula relatively maintained at this time. Basal ganglia and internal capsule maintained. No other acute evolving large vessel territory infarct. No mass lesion or midline shift. No hydrocephalus. No extra-axial fluid collection. Vascular: No hyperdense vessel. Skull: Scalp soft tissues and calvarium within normal limits. Sinuses/Orbits: Globes and orbital soft tissues within normal limits. Patient status post lens extraction bilaterally. Paranasal sinuses and mastoids are clear. ASPECTS [facility] Stroke Program Early CT Score) - Ganglionic level infarction (caudate, lentiform nuclei, internal capsule, insula, M1-M3 cortex): 6 -  Supraganglionic infarction (M4-M6 cortex): 2 Total score (0-10 with 10 being normal): 8 IMPRESSION: 1. Subtle loss of gray-white matter differentiation at the left frontal operculum and posterior left frontal region as above, suspicious for acute ischemic left MCA territory infarct. No intracranial hemorrhage. 2. ASPECTS is 8. 3. Stable atrophy with scattered remote infarcts as above. Dr. Olin Pia, of the Stroke Neurology service has been paged regarding these findings at 5:12 a.m. on 08/20/2016. Currently awaiting a call back. Results will be conveyed as soon as possible. Electronically Signed: By: Rise Mu M.D. On: 10/20/2016 05:17    Assessment and Plan:   1. Stroke 2. AFib on warfarin chronically    Her echo notes normal LVEF,  mild AS/AI and moderate MS, her LA is very dilated. with history of rheumatic fever.  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  Dr. Ladona Ridgel discussed with the patient/family adherence to diet without greens/Vitamin K and very close management if INR if need for antibiotics going forward.  We will defer management of her warfarin/anticoagulation given acute stroke to neurology service, with eventual goal of an INR of 2-3   Please recall if needed  Signed, Sheilah Pigeon, PA-C  10/20/2016 3:49 PM   EP Attending  Patient seen and examined. Agree with above. The patient has sustained a stroke and has been subtherapuetic on her anti-coagulation with warfarin. She has been difficutl to control. She has been placed on ASA as well. She denies chest pain or sob. She was treated with tpa. Her INR remains subtherapeutic. Her exam is notable for atrial fib and she has a soft systolic murmur. She does not appear to be volume overloaded. ECG reveals atrial fib with a controlled VR. I have discussed the findings with the patient and her daughters. We have no data to support the use of an OAC. I would suggest her dose of  warfarin be adjusted to get her therapuetic, use a beta blocker as needed (metoprolol 25 bid) for rate control. Goal INR is 2.5. She will need frequent followup for INR's until she leaves to go back to [place]. Not much data for additional ASA.   Leonia Reeves.D.

## 2016-10-20 NOTE — ED Notes (Signed)
Pt's purewick catheter changed, after linen was wet.

## 2016-10-20 NOTE — ED Provider Notes (Signed)
[facility]-EMERGENCY DEPT Provider Note   CSN: [id number] Arrival date & time: 10/20/16  0436   LEVEL 5 CAVEAT - APHASIA  History   Chief Complaint Chief Complaint  Patient presents with  . Code Stroke    HPI Alexa Fox is a 76 y.o. female.  HPI  76 year old female with a history of atrial fibrillation, diabetes, and a stroke diagnosed on 7/11 at 1216 in the morning who received TPA presents with recurrent strokelike symptoms. The history is limited due to the patient's aphasia. The history is obtained from EMS initially and then later on from the daughters. Patient was discharged from this hospital on 7/12 and back to her baseline. She is from out of town and is normally on warfarin for atrial fibrillation but was subtherapeutic on initial presentation and then on discharge. Patient was last seen normal at midnight when she went to bed. She woke up at 3:30 AM to go to the bathroom and was having trouble walking and was a phasic with a facial droop. That is when EMS was called. Patient has been in A. fib with RVR for EMS. Patient denies a current headache.  Past Medical History:  Diagnosis Date  . A-fib (HCC)   . Diabetes mellitus without complication (HCC)   . Hypertension   . Stroke [facility](HCC)     Patient Active Problem List   Diagnosis Date Noted  . A-fib (HCC) 10/20/2016  . Hyperlipidemia 10/20/2016  . DM (diabetes mellitus) (HCC) 10/20/2016  . Hypertension 10/20/2016  . Stroke (HCC) 10/18/2016  . 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 ABDOMINAL HYSTERECTOMY    . CHOLECYSTECTOMY      OB History    No data available       Home Medications    Prior to Admission medications   Medication Sig Start Date End Date Taking? Authorizing Provider  acetaminophen (TYLENOL) 325 MG tablet Take 325 mg by mouth every 6 (six) hours as needed  for mild pain.   Yes [provider]  aspirin EC 81 MG EC tablet Take 1 tablet (81 mg total) by mouth daily. 10/20/16  Yes Patteson, Paul DykesSamuel A, NP  furosemide (LASIX) 40 MG tablet Take 40 mg by mouth daily.    Yes [provider]  metFORMIN (GLUCOPHAGE) 500 MG tablet Take 250 mg by mouth at bedtime.   Yes [provider]  metoprolol tartrate (LOPRESSOR) 50 MG tablet Take 75 mg by mouth 2 (two) times daily.   Yes [provider]  potassium chloride SA (K-DUR,KLOR-CON) 20 MEQ tablet Take 20 mEq by mouth every morning.    Yes [provider]  simvastatin (ZOCOR) 20 MG tablet Take 20 mg by mouth daily.   Yes [provider]  warfarin (COUMADIN) 5 MG tablet Take 1 tablet (5 mg total) by mouth one time only at 6 PM. 10/20/16  Yes Patteson, Paul DykesSamuel A, NP    Family History No family history on file.  Social History Social History  Substance Use Topics  . Smoking status: Never Smoker  . Smokeless tobacco: Never Used  . Alcohol use No     Allergies   Eggs or egg-derived products   Review of Systems Review of Systems  Unable to perform ROS: Acuity of condition     Physical Exam Updated Vital Signs BP 121/62 (BP Location: Right Arm)   Pulse 93  Resp 18   Ht 5\' 2"  (1.575 m)   Wt 84.9 kg (187 lb 2.7 oz)   SpO2 98%   BMI 34.23 kg/m   Physical Exam  Constitutional: She appears well-developed and well-nourished.  HENT:  Head: Normocephalic and atraumatic.  Right Ear: External ear normal.  Left Ear: External ear normal.  Nose: Nose normal.  Eyes: Right eye exhibits no discharge. Left eye exhibits no discharge.  Cardiovascular: Normal rate, regular rhythm and normal heart sounds.   Pulmonary/Chest: Effort normal and breath sounds normal.  Abdominal: Soft. There is no tenderness.  Neurological: She is alert.  Right facial droop, aphasia with severe slurring. 5/5 strength LUE, LLE, RLE. RUE with 3/5 strength.   Skin: Skin is warm  and dry.  Nursing note and vitals reviewed.    ED Treatments / Results  Labs (all labs ordered are listed, but only abnormal results are displayed) Labs Reviewed  PROTIME-INR - Abnormal; Notable for the following:       Result Value   Prothrombin Time 17.8 (*)    All other components within normal limits  CBC - Abnormal; Notable for the following:    RBC 3.73 (*)    Hemoglobin 11.6 (*)    HCT 35.1 (*)    Platelets 118 (*)    All other components within normal limits  DIFFERENTIAL - Abnormal; Notable for the following:    Neutro Abs 8.5 (*)    All other components within normal limits  COMPREHENSIVE METABOLIC PANEL - Abnormal; Notable for the following:    Glucose, Bld 139 (*)    Calcium 8.7 (*)    Total Protein 5.7 (*)    Anion gap 3 (*)    All other components within normal limits  URINALYSIS, ROUTINE W REFLEX MICROSCOPIC - Abnormal; Notable for the following:    Color, Urine STRAW (*)    Hgb urine dipstick SMALL (*)    Leukocytes, UA SMALL (*)    Squamous Epithelial / LPF 0-5 (*)    All other components within normal limits  BRAIN NATRIURETIC PEPTIDE - Abnormal; Notable for the following:    B Natriuretic Peptide 511.1 (*)    All other components within normal limits  I-STAT CHEM 8, ED - Abnormal; Notable for the following:    Glucose, Bld 140 (*)    Hemoglobin 11.9 (*)    HCT 35.0 (*)    All other components within normal limits  CBG MONITORING, ED - Abnormal; Notable for the following:    Glucose-Capillary 127 (*)    All other components within normal limits  CBG MONITORING, ED - Abnormal; Notable for the following:    Glucose-Capillary 121 (*)    All other components within normal limits  ETHANOL  APTT  RAPID URINE DRUG SCREEN, HOSP PERFORMED  TROPONIN I  TROPONIN I  TROPONIN I  HEPARIN LEVEL (UNFRACTIONATED)  I-STAT TROPOININ, ED    EKG  EKG Interpretation None       Radiology Ct Angio Head W Or Wo Contrast  Result Date: 10/20/2016 CLINICAL  DATA:  Initial evaluation for acute stroke, right-sided weakness, slurred speech. EXAM: CT ANGIOGRAPHY HEAD AND NECK TECHNIQUE: Multidetector CT imaging of the head and neck was performed using the standard protocol during bolus administration of intravenous contrast. Multiplanar CT image reconstructions and MIPs were obtained to evaluate the vascular anatomy. Carotid stenosis measurements (when applicable) are obtained utilizing NASCET criteria, using the distal internal carotid diameter as the denominator. CONTRAST:  50 cc of Isovue  370. COMPARISON:  Prior CT from earlier same day as well as recent CTA from 10/18/2016. FINDINGS: CTA NECK FINDINGS Aortic arch: Aortic arch of normal caliber with normal 3 vessel morphology. No flow-limiting stenosis about the origin of the great vessels. Visualized subclavian artery is widely patent. Right carotid system: Right common and internal carotid artery's widely patent without stenosis, dissection, or occlusion. No significant atheromatous narrowing about the right carotid bifurcation. Left carotid system: Left common and internal carotid artery's are widely patent without stenosis, dissection, or occlusion. Mild calcified plaque at the proximal left ICA without stenosis. Vertebral arteries: Both of the vertebral arteries arise from the subclavian arteries. Vertebral arteries patent within the neck without stenosis, dissection, or occlusion. Skeleton: No acute osseus abnormality. No worrisome lytic or blastic osseous lesions. Moderate degenerative spondylolysis at C4-5 through C6-7. Other neck: No acute soft tissue abnormality within the neck. Salivary glands normal. Thyroid normal. No adenopathy. Upper chest: Small layering bilateral pleural effusions partially visualized. Associated atelectasis. Emphysema. Review of the MIP images confirms the above findings CTA HEAD FINDINGS Anterior circulation: Petrous segments widely patent bilaterally. Cavernous and supraclinoid  segments widely patent bilaterally without flow-limiting stenosis. Mild scattered atheromatous plaque noted within the cavernous right ICA. ICA termini patent. A1 segments patent. Anterior communicating artery normal. Anterior cerebral arteries patent to their distal aspects. M1 segments patent without stenosis or occlusion. No proximal M2 occlusion. Distal MCA branches well opacified and symmetric. Posterior circulation: Vertebral artery's patent to the vertebrobasilar junction without stenosis. Patent left PICA. Right PICA not visualized. Basilar artery widely patent. Superior cerebral arteries patent bilaterally. Both of the posterior cerebral arteries primarily supplied via the basilar and are patent to their distal aspects. Venous sinuses: Patent. Anatomic variants: No significant anatomic variant. No aneurysm or vascular malformation. Delayed phase: Not performed. Review of the MIP images confirms the above findings IMPRESSION: 1. Negative CTA for emergent large vessel occlusion. 2. Stable exam with mild atherosclerotic changes for patient age. No high-grade or flow-limiting stenosis. 3. Small layering bilateral pleural effusions with associated atelectasis. Critical Value/emergent results were called by telephone at the time of interpretation on 10/20/2016 at 5:42 am to Dr. Levander Campion, who verbally acknowledged these results. Electronically Signed   By: Rise Mu M.D.   On: 10/20/2016 06:04   Ct Angio Neck W And/or Wo Contrast  Result Date: 10/20/2016 CLINICAL DATA:  Initial evaluation for acute stroke, right-sided weakness, slurred speech. EXAM: CT ANGIOGRAPHY HEAD AND NECK TECHNIQUE: Multidetector CT imaging of the head and neck was performed using the standard protocol during bolus administration of intravenous contrast. Multiplanar CT image reconstructions and MIPs were obtained to evaluate the vascular anatomy. Carotid stenosis measurements (when applicable) are obtained utilizing NASCET  criteria, using the distal internal carotid diameter as the denominator. CONTRAST:  50 cc of Isovue 370. COMPARISON:  Prior CT from earlier same day as well as recent CTA from 10/18/2016. FINDINGS: CTA NECK FINDINGS Aortic arch: Aortic arch of normal caliber with normal 3 vessel morphology. No flow-limiting stenosis about the origin of the great vessels. Visualized subclavian artery is widely patent. Right carotid system: Right common and internal carotid artery's widely patent without stenosis, dissection, or occlusion. No significant atheromatous narrowing about the right carotid bifurcation. Left carotid system: Left common and internal carotid artery's are widely patent without stenosis, dissection, or occlusion. Mild calcified plaque at the proximal left ICA without stenosis. Vertebral arteries: Both of the vertebral arteries arise from the subclavian arteries. Vertebral arteries patent within the  neck without stenosis, dissection, or occlusion. Skeleton: No acute osseus abnormality. No worrisome lytic or blastic osseous lesions. Moderate degenerative spondylolysis at C4-5 through C6-7. Other neck: No acute soft tissue abnormality within the neck. Salivary glands normal. Thyroid normal. No adenopathy. Upper chest: Small layering bilateral pleural effusions partially visualized. Associated atelectasis. Emphysema. Review of the MIP images confirms the above findings CTA HEAD FINDINGS Anterior circulation: Petrous segments widely patent bilaterally. Cavernous and supraclinoid segments widely patent bilaterally without flow-limiting stenosis. Mild scattered atheromatous plaque noted within the cavernous right ICA. ICA termini patent. A1 segments patent. Anterior communicating artery normal. Anterior cerebral arteries patent to their distal aspects. M1 segments patent without stenosis or occlusion. No proximal M2 occlusion. Distal MCA branches well opacified and symmetric. Posterior circulation: Vertebral artery's  patent to the vertebrobasilar junction without stenosis. Patent left PICA. Right PICA not visualized. Basilar artery widely patent. Superior cerebral arteries patent bilaterally. Both of the posterior cerebral arteries primarily supplied via the basilar and are patent to their distal aspects. Venous sinuses: Patent. Anatomic variants: No significant anatomic variant. No aneurysm or vascular malformation. Delayed phase: Not performed. Review of the MIP images confirms the above findings IMPRESSION: 1. Negative CTA for emergent large vessel occlusion. 2. Stable exam with mild atherosclerotic changes for patient age. No high-grade or flow-limiting stenosis. 3. Small layering bilateral pleural effusions with associated atelectasis. Critical Value/emergent results were called by telephone at the time of interpretation on 10/20/2016 at 5:42 am to Dr. Levander Campion, who verbally acknowledged these results. Electronically Signed   By: Rise Mu M.D.   On: 10/20/2016 06:04   Mr Brain Wo Contrast  Result Date: 10/19/2016 CLINICAL DATA:  Initial evaluation for acute right-sided weakness with aphasia. EXAM: MRI HEAD WITHOUT CONTRAST TECHNIQUE: Multiplanar, multiecho pulse sequences of the brain and surrounding structures were obtained without intravenous contrast. COMPARISON:  Comparison made with prior CT from 10/17/2016 as well as earlier the same day. FINDINGS: Brain: Diffuse prominence of the CSF containing spaces is compatible with generalized age-related cerebral atrophy. Remote lacunar infarcts present within the right basal ganglia. Multiple scatter remote bilateral cerebellar infarcts present as well. Remote cortical infarct with associated chronic blood products present within the left parietal lobe. Minimal for age chronic small vessel ischemic disease. No abnormal foci of restricted diffusion to suggest acute or subacute ischemia. Gray-white matter differentiation maintained. No evidence for acute  intracranial hemorrhage. No mass lesion, midline shift or mass effect. No hydrocephalus. No extra-axial fluid collection. Major dural sinuses are grossly patent. Pituitary gland suprasellar region within normal limits. Vascular: Major intracranial vascular flow voids are maintained. Skull and upper cervical spine: Craniocervical junction within normal limits. Visualized upper cervical spine unremarkable. Bone marrow signal intensity within normal limits. No scalp soft tissue abnormality. Sinuses/Orbits: Globes and orbital soft tissues within normal limits. Patient status post lens extraction bilaterally. Paranasal sinuses are clear. No mastoid effusion. Inner ear structures normal. IMPRESSION: 1. No acute intracranial infarct or other process identified. 2. Generalized age-related cerebral atrophy with mild chronic small vessel ischemic disease and scattered remote infarcts as above. Electronically Signed   By: Rise Mu M.D.   On: 10/19/2016 02:11   Dg Chest Portable 1 View  Result Date: 10/20/2016 CLINICAL DATA:  76 year old female with shortness of breath and vomiting. Hypoxia. EXAM: PORTABLE CHEST 1 VIEW COMPARISON:  None FINDINGS: Mild cardiomegaly with mild vascular congestion. Minimal bibasilar atelectatic changes noted. No focal consolidation, pleural effusion, or pneumothorax. No acute osseous pathology. IMPRESSION: Mild cardiomegaly with  vascular congestion.  No focal consolidation. Electronically Signed   By: Elgie Collard M.D.   On: 10/20/2016 06:45   Ct Head Code Stroke W/o Cm  Addendum Date: 10/20/2016   ADDENDUM REPORT: 10/20/2016 07:04 ADDENDUM: Results were discussed by telephone with Dr. Levander Campion at approximately 5:30 a.m. on 10/20/2016. Additionally, there is a dictation in the initial report. Dr. Olin Pia was paged at 5:12 a.m. on 10/20/2016 (not 08/20/2016). Electronically Signed   By: Rise Mu M.D.   On: 10/20/2016 07:04   Result Date:  10/20/2016 CLINICAL DATA:  Code stroke. Initial evaluation for acute right-sided weakness, slurred speech. EXAM: CT HEAD WITHOUT CONTRAST TECHNIQUE: Contiguous axial images were obtained from the base of the skull through the vertex without intravenous contrast. COMPARISON:  Prior MRI from 10/18/2016. FINDINGS: Brain: Stable atrophy with chronic small vessel ischemic disease. Remote lacunar infarct within the right basal ganglia. Scatter remote bilateral cerebellar infarcts, with additional remote posterior left MCA territory infarct. No acute intracranial hemorrhage. There is subtle loss of gray-white matter differentiation at the left frontal operculum and supra ganglionic posterior left frontal lobe (series 6, image 48), suspicious for evolving acute ischemic left MCA territory infarct. Left insula relatively maintained at this time. Basal ganglia and internal capsule maintained. No other acute evolving large vessel territory infarct. No mass lesion or midline shift. No hydrocephalus. No extra-axial fluid collection. Vascular: No hyperdense vessel. Skull: Scalp soft tissues and calvarium within normal limits. Sinuses/Orbits: Globes and orbital soft tissues within normal limits. Patient status post lens extraction bilaterally. Paranasal sinuses and mastoids are clear. ASPECTS [facility] Stroke Program Early CT Score) - Ganglionic level infarction (caudate, lentiform nuclei, internal capsule, insula, M1-M3 cortex): 6 - Supraganglionic infarction (M4-M6 cortex): 2 Total score (0-10 with 10 being normal): 8 IMPRESSION: 1. Subtle loss of gray-white matter differentiation at the left frontal operculum and posterior left frontal region as above, suspicious for acute ischemic left MCA territory infarct. No intracranial hemorrhage. 2. ASPECTS is 8. 3. Stable atrophy with scattered remote infarcts as above. Dr. Olin Pia, of the Stroke Neurology service has been paged regarding these findings at 5:12 a.m. on 08/20/2016.  Currently awaiting a call back. Results will be conveyed as soon as possible. Electronically Signed: By: Rise Mu M.D. On: 10/20/2016 05:17    Procedures Procedures (including critical care time)  CRITICAL CARE Performed by: Pricilla Loveless T   Total critical care time: 30 minutes  Critical care time was exclusive of separately billable procedures and treating other patients.  Critical care was necessary to treat or prevent imminent or life-threatening deterioration.  Critical care was time spent personally by me on the following activities: development of treatment plan with patient and/or surrogate as well as nursing, discussions with consultants, evaluation of patient's response to treatment, examination of patient, obtaining history from patient or surrogate, ordering and performing treatments and interventions, ordering and review of laboratory studies, ordering and review of radiographic studies, pulse oximetry and re-evaluation of patient's condition.   Angiocath insertion Performed by: Pricilla Loveless T  Consent: Verbal consent obtained. Risks and benefits: risks, benefits and alternatives were discussed Time out: Immediately prior to procedure a "time out" was called to verify the correct patient, procedure, equipment, support staff and site/side marked as required.  Preparation: Patient was prepped and draped in the usual sterile fashion.  Vein Location: left basilic  Ultrasound Guided  Gauge: 20  Normal blood return and flush without difficulty Patient tolerance: Patient tolerated the procedure well with no immediate  complications.    Medications Ordered in ED Medications   stroke: mapping our early stages of recovery book (0 each Does not apply Hold 10/20/16 0752)  acetaminophen (TYLENOL) tablet 650 mg (not administered)    Or  acetaminophen (TYLENOL) solution 650 mg (not administered)    Or  acetaminophen (TYLENOL) suppository 650 mg (not  administered)  insulin aspart (novoLOG) injection 0-9 Units (1 Units Subcutaneous Given 10/20/16 0804)  hydrALAZINE (APRESOLINE) injection 5-10 mg (not administered)  heparin ADULT infusion 100 units/mL (25000 units/268mL sodium chloride 0.45%) (950 Units/hr Intravenous New Bag/Given 10/20/16 0806)  furosemide (LASIX) injection 20 mg (not administered)  0.9 %  sodium chloride infusion (110 mL/hr Intravenous Rate/Dose Change 10/20/16 0819)  iopamidol (ISOVUE-370) 76 % injection (50 mLs  Contrast Given 10/20/16 0459)  diltiazem (CARDIZEM) 1 mg/mL load via infusion 10 mg (10 mg Intravenous Bolus from Bag 10/20/16 0602)  sodium chloride 0.9 % bolus 500 mL (0 mLs Intravenous Stopped 10/20/16 0655)  ondansetron (ZOFRAN) injection 4 mg (4 mg Intravenous Given 10/20/16 0544)  furosemide (LASIX) injection 40 mg (40 mg Intravenous Given 10/20/16 0656)     Initial Impression / Assessment and Plan / ED Course  I have reviewed the triage vital signs and the nursing notes.  Pertinent labs & imaging results that were available during my care of the patient were reviewed by me and considered in my medical decision making (see chart for details).     Patient presents with recurrent CVA symptoms. ineligible for TPA given she just had it 2 days ago. No significant clot to indicate IR retrieval on CTA. 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 She has made mild improvement in ED. While in ED, developed afib with RVR, controlled with IV diltiazem, then drip stopped as she was stable and don't want to drop BP much given acute CVA. Discussed her subtherapeutic INR with neuro, Dr. Olin Pia, who recommends against IV heparin at this time given concern for possible hemorrhagic conversion is this is given. Will hold for now. She started getting nauseated and ultimately vomited once after she was sat up and into a bucket, did not appear to aspirate. Nausea resolved with zofran. Discussed with patient  and family that if she continues to vomit, would need intubation to help protect airway. However she is slowly improving and has had no further vomiting. Admit to stepdown.  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

## 2016-10-20 NOTE — ED Notes (Signed)
Pt transported MRI.

## 2016-10-20 NOTE — ED Notes (Signed)
CBG was 100 

## 2016-10-20 NOTE — ED Notes (Signed)
Pt began to vomit, sat pt up and gave 4mg  Zofran

## 2016-10-20 NOTE — ED Notes (Signed)
Admitting MD at the bedside.  

## 2016-10-20 NOTE — Code Documentation (Signed)
Responded to code stroke.  Pt reportedly LSN at midnight on 10/20/16 woke up to use bathroom at 0330 with slurred speech, facial droop, trouble walking and R arm weakness.  Initial NIHSS of 6, see chart for details.  Pt not candidate for TPA, had recently received TPA 2 days ago per pharmacist.  Head CT and CTA complete, CTA negative for emergent LVO.  Pt to receive MRI and be admitted for further workup.

## 2016-10-20 NOTE — Progress Notes (Signed)
STROKE TEAM PROGRESS NOTE   HISTORY OF PRESENT ILLNESS (per record) Alexa Fox was initially admitted 10/18/16 with acute stroke w/symptoms of aphasia and R hemiparesis, she was in AFib with known hx, INR was subtherapeutic at 1.55 and treated with tPA, discharged 10/18/16, neurology noting Subtle evolving hypodensity within the left occipital pole, suspicious for acute left PCA territory infarct in the setting of chronic small vessel disease and scattered chronic infarcts.  She was discharged 10/18/16 on warfarin with ASA bridge and INR at 1.68.  She was brought back to the hospital via EMS 10/20/2016 with recurrent aphasia and RUE weakness, INR 1.46.  Daughters at bedside report recent fluctuations/trouble with INR control after a course of antibiotics, and overall intractable INR control.  MRI on 10/20/2016 with moderate acute infarct affecting the left post more than precentral gyri and remote small vessel infarcts in the left parietal cortex, right caudate, and bilateral cerebellum.  Patient was not administered IV t-PA secondary to recent administration of tPA. She was admitted to General Neurology for further evaluation and treatment.   SUBJECTIVE (INTERVAL HISTORY) Her daughter is at the bedside.  Discussed at length why the patient required readmission, reviewed imaging with family, the treatment plan, and answered the daughter's questions.  Cardiology consulted, deferring to Neurology to restart coumadin with goal INR of 2-3 since patient is still in acute bleed risk period following CVA.  OBJECTIVE Temp:  [98.7 F (37.1 C)-98.9 F (37.2 C)] 98.9 F (37.2 C) (07/12 1556) Pulse Rate:  [56-108] 87 (07/13 0800) Cardiac Rhythm: Atrial fibrillation (07/13 0501) Resp:  [17-28] 20 (07/13 0800) BP: (89-138)/(48-77) 123/77 (07/13 0800) SpO2:  [93 %-100 %] 100 % (07/13 0800) Weight:  [84.9 kg (187 lb 2.7 oz)] 84.9 kg (187 lb 2.7 oz) (07/13 0457)  CBC:  Recent Labs Lab 10/17/16 2245  10/18/16 0428 10/20/16 0441 10/20/16 0452  WBC 9.0 9.7 10.4  --   NEUTROABS 5.8  --  8.5*  --   HGB 13.3 12.5 11.6* 11.9*  HCT 39.3 38.6 35.1* 35.0*  MCV 92.9 93.5 94.1  --   PLT 185 140* 118*  --     Basic Metabolic Panel:  Recent Labs Lab 10/18/16 0428 10/20/16 0441 10/20/16 0452  NA 140 136 142  K 4.1 4.3 4.4  CL 109 111 107  CO2 20* 22  --   GLUCOSE 126* 139* 140*  BUN 24* 9 10  CREATININE 0.92 0.81 0.70  CALCIUM 8.6* 8.7*  --     Lipid Panel:    Component Value Date/Time   CHOL 157 10/18/2016 0428   TRIG 85 10/18/2016 0428   HDL 55 10/18/2016 0428   CHOLHDL 2.9 10/18/2016 0428   VLDL 17 10/18/2016 0428   LDLCALC 85 10/18/2016 0428   HgbA1c:  Lab Results  Component Value Date   HGBA1C 5.9 (H) 10/18/2016   Urine Drug Screen:    Component Value Date/Time   LABOPIA NONE DETECTED 10/20/2016 0715   COCAINSCRNUR NONE DETECTED 10/20/2016 0715   LABBENZ NONE DETECTED 10/20/2016 0715   AMPHETMU NONE DETECTED 10/20/2016 0715   THCU NONE DETECTED 10/20/2016 0715   LABBARB NONE DETECTED 10/20/2016 0715    Alcohol Level     Component Value Date/Time   ETH <5 10/20/2016 0441    IMAGING  Ct Angio Head W and Wo Contrast 10/20/2016 IMPRESSION: 1. Negative CTA for emergent large vessel occlusion. 2. Stable exam with mild atherosclerotic changes for patient age. No high-grade or flow-limiting stenosis. 3. Small layering  bilateral pleural effusions with associated atelectasis.  Mr Brain Wo Contrast 10/20/2016 IMPRESSION: 1. Moderate acute infarct affecting the left post more than precentral gyri. 2. 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  Mr Brain Wo Contrast 10/19/2016 IMPRESSION: 1. No acute intracranial infarct or other process identified. 2. Generalized age-related cerebral atrophy with mild chronic small vessel ischemic disease and scattered remote infarcts as above.  Dg Chest Portable 1  View 10/20/2016 IMPRESSION: Mild cardiomegaly with vascular congestion.  No focal consolidation.   Ct Head Code Stroke W/o Cm 10/20/2016   IMPRESSION: 1. Subtle loss of gray-white matter differentiation at the left frontal operculum and posterior left frontal region as above, suspicious for acute ischemic left MCA territory infarct. No intracranial hemorrhage. 2. ASPECTS is 8. 3. Stable atrophy with scattered remote infarcts as above.     PHYSICAL EXAM Pleasant middle aged Caucasian lady not in distress.  . Afebrile. Head is nontraumatic. Neck is supple without bruit.    Cardiac exam no murmur or gallop. Lungs are clear to auscultation. Distal pulses are well felt. Neurological Exam :  Awake alert oriented 2. Moderate dysarthria but can be understood. Follows commands well. Extraocular moments are full range without nystagmus. Blinks to threat bilaterally. Fundi were not visualized. Vision acuity seems adequate. Right facial weakness. Tongue midline. Motor system exam reveals no upper or lower extremity drift but weakness of right grip and intrinsic hand muscles. Orbits left over right upper extremity. Mild weakness of right hip flexors and ankle dorsiflexors. Coordination is slightly impaired on the right compared to the left. Gait not tested. ASSESSMENT/PLAN Alexa Fox is a 76 y.o. female with history of A. fib, diabetes, hypertension, and stroke presenting with worsening aphasia and R hemiparesis following her discharge on 10/19/2016. She did not receive IV t-PA due to recent administration of tPA.   Stroke: Moderate acute infarct affecting the left post more than precentral gyri and remote small vessel infarcts in the left parietal cortex, right caudate, and bilateral cerebellum in the setting of atrial fibrillation with subtherapeutic INR  Resultant  dysarthria, right facial droop and mild right hand weakness  CT head: Subtle loss of gray-white matter differentiation at the left  frontal operculum and posterior left frontal region as above, suspicious for acute ischemic left MCA territory infarct  MRI head: Moderate acute infarct affecting the left post more than precentral gyri and remote small vessel infarcts in the left parietal cortex, right caudate, and bilateral cerebellum  MRA head  not ordered  Carotid Doppler: B ICA 1-39% stenosis, VAs antegrade  2D Echo: EF 55-60%. No source of embolus   LDL 85   HgbA1c 5.9  SCDs for VTE prophylaxis  Diet NPO time specified  aspirin 81 mg daily and warfarin daily prior to admission, now on No antithrombotic  Patient counseled to be compliant with her antithrombotic medications  Ongoing aggressive stroke risk factor management  Therapy recommendations:  pending  Disposition:   pending  Hyperlipidemia  Home meds: atorvastatin 40mg  PO daily , resumed in hospital  LDL 85, goal < 70  Continue statin at discharge  Other Stroke Risk Factors  Advanced age  Obesity, Body mass index is 34.23 kg/m., recommend weight loss, diet and exercise as appropriate   Hx stroke/TIA  Other Active Problems  Subtherapeutic INR: 1.46 on 10/20/2016, dose increased 10/19/2016, daily checks with INR goal of 2-3  Hospital day # 0  I have personally examined this patient, reviewed  notes, independently viewed imaging studies, participated in medical decision making and plan of care.ROS completed by me personally and pertinent positives fully documented  I have made any additions or clarifications directly to the above note. . She has presented with recurrence/worsening of her recent stroke deficits and MRI and now confirms left MCA branch infarct which was not visible on the initial MRI  2 days ago perhaps since it was done within a few hours of patient's presentation and IV tPA administration during that admission. CT angiogram shows no correctable lesion. Patient has valvular atrial fibrillation hence is not a candidate for the  newer anticoagulants. Continue warfarin with INR goal between 2 and 3. Consult cardiology for their opinion as well. Long discussion with the patient and daughter at the bedside and answered questions about her presentation, imaging studies, treatment plan and answered questions. I do not believe any further stroke workup is necessary given her recent hospitalization and workup. Greater than 50% time during this 35 minute visit was spent on counseling and coordination of care about her embolic stroke, atrial fibrillation, discussion about treatment options and answering questions.  Delia HeadyPramod Zay Yeargan, MD Medical Director [facility] Pager: [phone] 10/20/2016 5:39 PM   To contact Stroke Continuity provider, please refer to [email or web address]. After hours, contact General Neurology

## 2016-10-20 NOTE — ED Notes (Signed)
Speech Therapy at the bedside

## 2016-10-20 NOTE — H&P (Addendum)
History and Physical    Alexa Fox ZOX:[id number] DOB: 1940/06/02 DOA: 10/20/2016   PCP: System, Pcp Not In   Patient coming from:  Home    Chief Complaint: slurred speech,  R arm weakness, slurred speech.   HPI: Alexa Fox is a 76 y.o. female with medical history significant for Atrial Fibrillation, DM, HTN, history of TIA recently hospitalized at Neuro ICU  from 7/11 to 7/12 for the treatment of acute CVA requiring tPA at 0016 hrs on 7/11, leaving to home without residual symptoms, returning to the hospital after waking up at 3:30 with slurred speech, R facial droop and R arm weakness. Last seen normal at midnight. History is obtained by daughter as patient is unable to communicate. She states until then she was in her usual state of health, except of mild headaches, without vision changes. She was able to swallow. She had no respiratory complaints. She denies any chest pain. Of note, at the ED she developed RVR controlled with Cardizem IV SHe had been taking ASA but did not get to take her Coumadin as she was readmitted . She did not get to take her other home meds due to dysphagia. No apparent abdominal pain. SHe has nausea due to dysphagia. No confusion is reported. No urinary or bowel incontinence or retention. She is able to ambulate. Patient is very distraught at this time, in view of these acute symptoms . At this time, she is not a candidate for tPA as she had received it previously mentioned and is NIHS 6. Risk factors include age, HTN, Afib, recent long distance trip from [place] where she lives, under-therapeutic INR, on d/c at 1.46, mild LDL and obesity.   ED Course:  BP 121/62 (BP Location: Right Arm)   Pulse 93   Resp 18   Ht 5\' 2"  (1.575 m)   Wt 84.9 kg (187 lb 2.7 oz)   SpO2 98%   BMI 34.23 kg/m   CMET normal GLu 140 Hb 11.9 CT head negative for emergent large vessel occlusion CT angio neck: no high grade flow limiting stenosis  MRI pending  CXR mild  cardiomegaly with vascular congestion, no focal consolidation  Initial EKG Afib with RVR controlled with Cardizem. She has RBBB in lead 3 and old anteroseptal infarct  2 D echo 7/11 EF 55-60 normal systolic function  TN 0.01 WBC 10.4 Plts 118  A1C 5.9  Recent lipid panel on 7/11 unremarkable except for LDL 86 (goal 70)   Futrher information from prior admission as follows:  " Ct Head Wo Contrast 10/18/2016 IMPRESSION: 1. No acute intracranial hemorrhage or other abnormality identified status post tPA. 2. No other acute intracranial process identified. Previously questioned hypodensity at the left occipital pole is not seen on this exam. This was most likely artifactual on prior study. 3. Stable atrophy with scattered remote infarcts as above.   Ct Head Wo Contrast 10/17/2016 IMPRESSION: 1. Question subtle evolving hypodensity within the left occipital pole, suspicious for acute left PCA territory infarct. No acute intracranial hemorrhage. 2. ASPECTS is 10 3. Age-related cerebral atrophy with mild chronic small vessel ischemic disease with scattered remote infarcts as above.   Alexa Fox Wo Contrast 10/19/2016 IMPRESSION: 1. No acute intracranial infarct or other process identified. 2. Generalized age-related cerebral atrophy with mild chronic small vessel ischemic disease and scattered remote infarcts as above."  Review of Systems:  As per HPI otherwise all other systems reviewed and are negative  Past Medical History:  Diagnosis Date  .  A-fib (HCC)   . Diabetes mellitus without complication (HCC)   . Hypertension   . Stroke [facility])     Past Surgical History:  Procedure Laterality Date  . ABDOMINAL HYSTERECTOMY    . CHOLECYSTECTOMY      Social History Social History   Social History  . Marital status: Divorced    Spouse name: N/A  . Number of children: N/A  . Years of education: N/A   Occupational History  . Not on file.   Social History Main Topics  . Smoking status:  Never Smoker  . Smokeless tobacco: Never Used  . Alcohol use No  . Drug use: No  . Sexual activity: Not on file   Other Topics Concern  . Not on file   Social History Narrative  . No narrative on file     Allergies  Allergen Reactions  . Eggs Or Egg-Derived Products     Reaction unknown to patient/family    No family history on file.    Prior to Admission medications   Medication Sig Start Date End Date Taking? Authorizing Provider  acetaminophen (TYLENOL) 325 MG tablet Take 325 mg by mouth every 6 (six) hours as needed for mild pain.   Yes [provider]  aspirin EC 81 MG EC tablet Take 1 tablet (81 mg total) by mouth daily. 10/20/16  Yes Patteson, Paul Dykes, NP  furosemide (LASIX) 40 MG tablet Take 40 mg by mouth daily.    Yes [provider]  metFORMIN (GLUCOPHAGE) 500 MG tablet Take 250 mg by mouth at bedtime.   Yes [provider]  metoprolol tartrate (LOPRESSOR) 50 MG tablet Take 75 mg by mouth 2 (two) times daily.   Yes [provider]  potassium chloride SA (K-DUR,KLOR-CON) 20 MEQ tablet Take 20 mEq by mouth every morning.    Yes [provider]  simvastatin (ZOCOR) 20 MG tablet Take 20 mg by mouth daily.   Yes [provider]  warfarin (COUMADIN) 5 MG tablet Take 1 tablet (5 mg total) by mouth one time only at 6 PM. 10/20/16  Yes Marton Redwood, NP    Physical Exam:  Vitals:   10/20/16 0800 10/20/16 0818 10/20/16 0819 10/20/16 0819  BP: 123/77 121/62  121/62  Pulse: 87 (!) 103 92 93  Resp: 20  18 18   SpO2: 100% 99% 98% 98%  Weight:      Height:       Constitutional: NAD, ill appearing, tearful  Eyes: PERRL, lids and conjunctivae normal ENMT: Mucous membranes are moist, without exudate or lesions  Neck: normal, supple, no masses, no thyromegaly Respiratory: clear to auscultation bilaterally, no wheezing,  Crackles audible anteriorly . Normal respiratory effort  Cardiovascular: irregularly irregular rate  and rhythm, no murmurs, rubs or gallops. No extremity edema. 2+ pedal pulses. No carotid bruits.  Abdomen: Soft, non tender, No hepatosplenomegaly. Bowel sounds positive.  Musculoskeletal: no clubbing / cyanosis. Moves all extremities Skin: no jaundice, No lesions.  Neurologic: Sensation intact. Severe RUE weakness, R facial assymetry Psychiatric:   Alert and oriented x 3. Tearful    Labs on Admission: I have personally reviewed following labs and imaging studies  CBC:  Recent Labs Lab 10/17/16 2245 10/18/16 0428 10/20/16 0441 10/20/16 0452  WBC 9.0 9.7 10.4  --   NEUTROABS 5.8  --  8.5*  --   HGB 13.3 12.5 11.6* 11.9*  HCT 39.3 38.6 35.1* 35.0*  MCV 92.9 93.5 94.1  --   PLT 185  140* 118*  --     Basic Metabolic Panel:  Recent Labs Lab 10/17/16 2245 10/18/16 0428 10/20/16 0441 10/20/16 0452  NA 138 140 136 142  K 3.8 4.1 4.3 4.4  CL 102 109 111 107  CO2 28 20* 22  --   GLUCOSE 171* 126* 139* 140*  BUN 29* 24* 9 10  CREATININE 1.04* 0.92 0.81 0.70  CALCIUM 9.3 8.6* 8.7*  --     GFR: Estimated Creatinine Clearance: 59.5 mL/min (by C-G formula based on SCr of 0.7 mg/dL).  Liver Function Tests:  Recent Labs Lab 10/17/16 2245 10/20/16 0441  AST 22 17  ALT 23 16  ALKPHOS 55 43  BILITOT 0.5 1.2  PROT 7.1 5.7*  ALBUMIN 4.0 3.6   No results for input(s): LIPASE, AMYLASE in the last 168 hours. No results for input(s): AMMONIA in the last 168 hours.  Coagulation Profile:  Recent Labs Lab 10/17/16 2245 10/19/16 1211 10/20/16 0441  INR 1.55 1.68 1.46    Cardiac Enzymes:  Recent Labs Lab 10/17/16 2245 10/19/16 0029 10/19/16 0635 10/19/16 1101 10/20/16 0731  TROPONINI <0.03 <0.03 <0.03 <0.03 <0.03    BNP (last 3 results) No results for input(s): PROBNP in the last 8760 hours.  HbA1C:  Recent Labs  10/18/16 0428  HGBA1C 5.9*    CBG:  Recent Labs Lab 10/19/16 0808 10/19/16 1221 10/19/16 1529 10/20/16 0440 10/20/16 0755  GLUCAP 93  109* 152* 127* 121*    Lipid Profile:  Recent Labs  10/18/16 0428  CHOL 157  HDL 55  LDLCALC 85  TRIG 85  CHOLHDL 2.9    Thyroid Function Tests: No results for input(s): TSH, T4TOTAL, FREET4, T3FREE, THYROIDAB in the last 72 hours.  Anemia Panel: No results for input(s): VITAMINB12, FOLATE, FERRITIN, TIBC, IRON, RETICCTPCT in the last 72 hours.  Urine analysis:    Component Value Date/Time   COLORURINE STRAW (A) 10/20/2016 0715   APPEARANCEUR CLEAR 10/20/2016 0715   LABSPEC 1.017 10/20/2016 0715   PHURINE 5.0 10/20/2016 0715   GLUCOSEU NEGATIVE 10/20/2016 0715   HGBUR SMALL (A) 10/20/2016 0715   BILIRUBINUR NEGATIVE 10/20/2016 0715   KETONESUR NEGATIVE 10/20/2016 0715   PROTEINUR NEGATIVE 10/20/2016 0715   NITRITE NEGATIVE 10/20/2016 0715   LEUKOCYTESUR SMALL (A) 10/20/2016 0715    Sepsis Labs: @LABRCNTIP (procalcitonin:4,lacticidven:4) ) Recent Results (from the past 240 hour(s))  MRSA PCR Screening     Status: None   Collection Time: 10/18/16  1:56 AM  Result Value Ref Range Status   MRSA by PCR NEGATIVE NEGATIVE Final    Comment:        The GeneXpert MRSA Assay (FDA approved for NASAL specimens only), is one component of a comprehensive MRSA colonization surveillance program. It is not intended to diagnose MRSA infection nor to guide or monitor treatment for MRSA infections.      Radiological Exams on Admission: Ct Angio Head W Or Wo Contrast  Result Date: 10/20/2016 CLINICAL DATA:  Initial evaluation for acute stroke, right-sided weakness, slurred speech. EXAM: CT ANGIOGRAPHY HEAD AND NECK TECHNIQUE: Multidetector CT imaging of the head and neck was performed using the standard protocol during bolus administration of intravenous contrast. Multiplanar CT image reconstructions and MIPs were obtained to evaluate the vascular anatomy. Carotid stenosis measurements (when applicable) are obtained utilizing NASCET criteria, using the distal internal carotid  diameter as the denominator. CONTRAST:  50 cc of Isovue 370. COMPARISON:  Prior CT from earlier same day as well as recent CTA from 10/18/2016.  FINDINGS: CTA NECK FINDINGS Aortic arch: Aortic arch of normal caliber with normal 3 vessel morphology. No flow-limiting stenosis about the origin of the great vessels. Visualized subclavian artery is widely patent. Right carotid system: Right common and internal carotid artery's widely patent without stenosis, dissection, or occlusion. No significant atheromatous narrowing about the right carotid bifurcation. Left carotid system: Left common and internal carotid artery's are widely patent without stenosis, dissection, or occlusion. Mild calcified plaque at the proximal left ICA without stenosis. Vertebral arteries: Both of the vertebral arteries arise from the subclavian arteries. Vertebral arteries patent within the neck without stenosis, dissection, or occlusion. Skeleton: No acute osseus abnormality. No worrisome lytic or blastic osseous lesions. Moderate degenerative spondylolysis at C4-5 through C6-7. Other neck: No acute soft tissue abnormality within the neck. Salivary glands normal. Thyroid normal. No adenopathy. Upper chest: Small layering bilateral pleural effusions partially visualized. Associated atelectasis. Emphysema. Review of the MIP images confirms the above findings CTA HEAD FINDINGS Anterior circulation: Petrous segments widely patent bilaterally. Cavernous and supraclinoid segments widely patent bilaterally without flow-limiting stenosis. Mild scattered atheromatous plaque noted within the cavernous right ICA. ICA termini patent. A1 segments patent. Anterior communicating artery normal. Anterior cerebral arteries patent to their distal aspects. M1 segments patent without stenosis or occlusion. No proximal M2 occlusion. Distal MCA branches well opacified and symmetric. Posterior circulation: Vertebral artery's patent to the vertebrobasilar junction  without stenosis. Patent left PICA. Right PICA not visualized. Basilar artery widely patent. Superior cerebral arteries patent bilaterally. Both of the posterior cerebral arteries primarily supplied via the basilar and are patent to their distal aspects. Venous sinuses: Patent. Anatomic variants: No significant anatomic variant. No aneurysm or vascular malformation. Delayed phase: Not performed. Review of the MIP images confirms the above findings IMPRESSION: 1. Negative CTA for emergent large vessel occlusion. 2. Stable exam with mild atherosclerotic changes for patient age. No high-grade or flow-limiting stenosis. 3. Small layering bilateral pleural effusions with associated atelectasis. Critical Value/emergent results were called by telephone at the time of interpretation on 10/20/2016 at 5:42 am to Dr. Levander Campion, who verbally acknowledged these results. Electronically Signed   By: Rise Mu M.D.   On: 10/20/2016 06:04   Ct Angio Neck W And/or Wo Contrast  Result Date: 10/20/2016 CLINICAL DATA:  Initial evaluation for acute stroke, right-sided weakness, slurred speech. EXAM: CT ANGIOGRAPHY HEAD AND NECK TECHNIQUE: Multidetector CT imaging of the head and neck was performed using the standard protocol during bolus administration of intravenous contrast. Multiplanar CT image reconstructions and MIPs were obtained to evaluate the vascular anatomy. Carotid stenosis measurements (when applicable) are obtained utilizing NASCET criteria, using the distal internal carotid diameter as the denominator. CONTRAST:  50 cc of Isovue 370. COMPARISON:  Prior CT from earlier same day as well as recent CTA from 10/18/2016. FINDINGS: CTA NECK FINDINGS Aortic arch: Aortic arch of normal caliber with normal 3 vessel morphology. No flow-limiting stenosis about the origin of the great vessels. Visualized subclavian artery is widely patent. Right carotid system: Right common and internal carotid artery's widely patent  without stenosis, dissection, or occlusion. No significant atheromatous narrowing about the right carotid bifurcation. Left carotid system: Left common and internal carotid artery's are widely patent without stenosis, dissection, or occlusion. Mild calcified plaque at the proximal left ICA without stenosis. Vertebral arteries: Both of the vertebral arteries arise from the subclavian arteries. Vertebral arteries patent within the neck without stenosis, dissection, or occlusion. Skeleton: No acute osseus abnormality. No worrisome lytic or blastic  osseous lesions. Moderate degenerative spondylolysis at C4-5 through C6-7. Other neck: No acute soft tissue abnormality within the neck. Salivary glands normal. Thyroid normal. No adenopathy. Upper chest: Small layering bilateral pleural effusions partially visualized. Associated atelectasis. Emphysema. Review of the MIP images confirms the above findings CTA HEAD FINDINGS Anterior circulation: Petrous segments widely patent bilaterally. Cavernous and supraclinoid segments widely patent bilaterally without flow-limiting stenosis. Mild scattered atheromatous plaque noted within the cavernous right ICA. ICA termini patent. A1 segments patent. Anterior communicating artery normal. Anterior cerebral arteries patent to their distal aspects. M1 segments patent without stenosis or occlusion. No proximal M2 occlusion. Distal MCA branches well opacified and symmetric. Posterior circulation: Vertebral artery's patent to the vertebrobasilar junction without stenosis. Patent left PICA. Right PICA not visualized. Basilar artery widely patent. Superior cerebral arteries patent bilaterally. Both of the posterior cerebral arteries primarily supplied via the basilar and are patent to their distal aspects. Venous sinuses: Patent. Anatomic variants: No significant anatomic variant. No aneurysm or vascular malformation. Delayed phase: Not performed. Review of the MIP images confirms the above  findings IMPRESSION: 1. Negative CTA for emergent large vessel occlusion. 2. Stable exam with mild atherosclerotic changes for patient age. No high-grade or flow-limiting stenosis. 3. Small layering bilateral pleural effusions with associated atelectasis. Critical Value/emergent results were called by telephone at the time of interpretation on 10/20/2016 at 5:42 am to Dr. Levander Campionob Yapundich, who verbally acknowledged these results. Electronically Signed   By: Rise MuBenjamin  McClintock M.D.   On: 10/20/2016 06:04   Alexa Fox Wo Contrast  Result Date: 10/19/2016 CLINICAL DATA:  Initial evaluation for acute right-sided weakness with aphasia. EXAM: MRI HEAD WITHOUT CONTRAST TECHNIQUE: Multiplanar, multiecho pulse sequences of the Fox and surrounding structures were obtained without intravenous contrast. COMPARISON:  Comparison made with prior CT from 10/17/2016 as well as earlier the same day. FINDINGS: Fox: Diffuse prominence of the CSF containing spaces is compatible with generalized age-related cerebral atrophy. Remote lacunar infarcts present within the right basal ganglia. Multiple scatter remote bilateral cerebellar infarcts present as well. Remote cortical infarct with associated chronic blood products present within the left parietal lobe. Minimal for age chronic small vessel ischemic disease. No abnormal foci of restricted diffusion to suggest acute or subacute ischemia. Gray-white matter differentiation maintained. No evidence for acute intracranial hemorrhage. No mass lesion, midline shift or mass effect. No hydrocephalus. No extra-axial fluid collection. Major dural sinuses are grossly patent. Pituitary gland suprasellar region within normal limits. Vascular: Major intracranial vascular flow voids are maintained. Skull and upper cervical spine: Craniocervical junction within normal limits. Visualized upper cervical spine unremarkable. Bone marrow signal intensity within normal limits. No scalp soft tissue  abnormality. Sinuses/Orbits: Globes and orbital soft tissues within normal limits. Patient status post lens extraction bilaterally. Paranasal sinuses are clear. No mastoid effusion. Inner ear structures normal. IMPRESSION: 1. No acute intracranial infarct or other process identified. 2. Generalized age-related cerebral atrophy with mild chronic small vessel ischemic disease and scattered remote infarcts as above. Electronically Signed   By: Rise MuBenjamin  McClintock M.D.   On: 10/19/2016 02:11   Dg Chest Portable 1 View  Result Date: 10/20/2016 CLINICAL DATA:  76 year old female with shortness of breath and vomiting. Hypoxia. EXAM: PORTABLE CHEST 1 VIEW COMPARISON:  None FINDINGS: Mild cardiomegaly with mild vascular congestion. Minimal bibasilar atelectatic changes noted. No focal consolidation, pleural effusion, or pneumothorax. No acute osseous pathology. IMPRESSION: Mild cardiomegaly with vascular congestion.  No focal consolidation. Electronically Signed   By: Ceasar MonsArash  Radparvar M.D.  On: 10/20/2016 06:45   Ct Head Code Stroke W/o Cm  Addendum Date: 10/20/2016   ADDENDUM REPORT: 10/20/2016 07:04 ADDENDUM: Results were discussed by telephone with Dr. Levander Campion at approximately 5:30 a.m. on 10/20/2016. Additionally, there is a dictation in the initial report. Dr. Olin Pia was paged at 5:12 a.m. on 10/20/2016 (not 08/20/2016). Electronically Signed   By: Rise Mu M.D.   On: 10/20/2016 07:04   Result Date: 10/20/2016 CLINICAL DATA:  Code stroke. Initial evaluation for acute right-sided weakness, slurred speech. EXAM: CT HEAD WITHOUT CONTRAST TECHNIQUE: Contiguous axial images were obtained from the base of the skull through the vertex without intravenous contrast. COMPARISON:  Prior MRI from 10/18/2016. FINDINGS: Fox: Stable atrophy with chronic small vessel ischemic disease. Remote lacunar infarct within the right basal ganglia. Scatter remote bilateral cerebellar infarcts, with additional  remote posterior left MCA territory infarct. No acute intracranial hemorrhage. There is subtle loss of gray-white matter differentiation at the left frontal operculum and supra ganglionic posterior left frontal lobe (series 6, image 48), suspicious for evolving acute ischemic left MCA territory infarct. Left insula relatively maintained at this time. Basal ganglia and internal capsule maintained. No other acute evolving large vessel territory infarct. No mass lesion or midline shift. No hydrocephalus. No extra-axial fluid collection. Vascular: No hyperdense vessel. Skull: Scalp soft tissues and calvarium within normal limits. Sinuses/Orbits: Globes and orbital soft tissues within normal limits. Patient status post lens extraction bilaterally. Paranasal sinuses and mastoids are clear. ASPECTS [facility] Stroke Program Early CT Score) - Ganglionic level infarction (caudate, lentiform nuclei, internal capsule, insula, M1-M3 cortex): 6 - Supraganglionic infarction (M4-M6 cortex): 2 Total score (0-10 with 10 being normal): 8 IMPRESSION: 1. Subtle loss of gray-white matter differentiation at the left frontal operculum and posterior left frontal region as above, suspicious for acute ischemic left MCA territory infarct. No intracranial hemorrhage. 2. ASPECTS is 8. 3. Stable atrophy with scattered remote infarcts as above. Dr. Olin Pia, of the Stroke Neurology service has been paged regarding these findings at 5:12 a.m. on 08/20/2016. Currently awaiting a call back. Results will be conveyed as soon as possible. Electronically Signed: By: Rise Mu M.D. On: 10/20/2016 05:17    EKG: Independently reviewed.  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 Seizure vs. Atypical migraine  Not a TPA candidate due to recent tPA on prior admission at 0016 hrs on 7/11 . CT head shows no acute intracranial abnormalities, but prior CT angio head showed question subtle evolving hypodensity within the left occipital pole, suspicious for acute left PCA territory infarct.  Neck US  Neg for high degree block. 2 D echo negative for severe abnormalities, EF 55-60. Plts 118. UDS pending Last dose ASA day prior admission. Risk factors for CVA include  age, HTN, Afib, recent long distance trip from [place] where she lives, under-therapeutic INR with a skipped coumadin dose day prior , on d/c at 1.46, mild LDL and prior TIA  and obesity.     Admit to SDU Stroke order set  MRI Fox  Allow permissive HTN until MRI results  PT/OT/SLP No Heparin for now as recommended by Neuro worried  about hemorrhagic conversion  Seizure precautions Ativan 1 mg prn seizures  Neuro  following, awaiting other recommendations   Hypertension BP 112/65   Pulse 92    Controlled Continue Lopressor in IV form due to dysphagia, after Alexa  Add Hydralazine Q6 hours as needed for BP 200/100   Hyperlipidemia LDL 86, goal 70  Continue home statins if able to swallow   Atrial Fibrillation  on anticoagulation with Heparin per Pharmacy, was to initiate Coumadin but had to be readmitted and this med was not started. Had 1 episode of RVR in the 130s, controlled with Cardizem. Tn neg. EKG Afib with initial RVR, old RBBB and old infarct. CXR mild vascular congestion, for which she received Lasix 40 mg IV x1. Denies CP or palps    Rate controlled Lasix 20 mg IV daily  Cardizem prn    Type II Diabetes Current blood sugar level is 140  Lab Results  Component Value Date   HGBA1C 5.9 (H) 10/18/2016  Hold home oral diabetic medications.  SSI  Anemia , acute  Hemoglobin on admission 11.9.  Baseline Hb 12-13 . No acute bleeding issues  Hemoccult  Repeat CBC in am  No transfusion is indicated at this time Anemia panel   Thrombocytopenia Likely due dilution and recent heparin No transfusion is indicated at this time Monitor counts closely Transfuse 1 unit of platelets if count is less or equal than 10,000 or 20,000 if the patient is acutely bleeding HIT panel    DVT prophylaxis: Heparin per Pharmacy  Code Status:   Full     Family Communication:  Discussed with patient Disposition Plan: Expect patient to be discharged to home  after condition improves Consults called:    Neuro per EDP  Admission status: SDU    Marcos Eke, PA-C [facility]   10/20/2016, 8:57 AM   Attending MD note  Patient was seen, examined,treatment plan was discussed with the  Advance Practice Provider.  I have personally reviewed the clinical findings, lab,EKG, imaging studies and management of this patient in detail.I have also reviewed the orders written for this patient which were under my direction. I agree with the documentation, as recorded by the Advance Practice Provider.   Maraki Macquarrie is a 76 y.o. female with a Past Medical History significant for diabetes, atrial fibrillation, hypertension and TIA who presents with strokelike symptoms. MRI showed stroke. Appreciate recommendations from neurology.  On my exam patient displays dysarthria.    Haydee Salter, MD Family Medicine See Amion for pager # [facility]

## 2016-10-20 NOTE — Progress Notes (Signed)
ANTICOAGULATION CONSULT NOTE  Pharmacy Consult for heparin Indication: atrial fibrillation, acute stroke  Heparin Dosing Weight: 69.3 kg   Assessment: 75 yof with hx on afib on warfarin pta admitted with ischemic stroke earlier this week, s/p TPA 7/11 at 0016. Pt discharged home 7/12 with instructions to resume warfarin with asa bridge. Now presents again with stroke on 7/13. Pharmacy consulted to dose heparin. Will target lower goal with no bolus with acute stroke. INR 1.46 on admit - ok to start heparin now. Hg low stable, plt down to 118. CT head negative for bleed. No bleeding issues documented. MRI brain pending.  Goal of Therapy:  Heparin level 0.3-0.5 units/ml, no bolus Monitor platelets by anticoagulation protocol: Yes   Plan:  No bolus Start heparin at 950 units/h 8h heparin level Daily heparin level/CBC Monitor s/sx bleeding Warfarin on hold   Alexa BertinHaley Monique Fox, PharmD, BCPS Clinical Pharmacist 10/20/2016 7:46 AM

## 2016-10-20 NOTE — ED Notes (Signed)
Phlebotomy at the bedside  

## 2016-10-20 NOTE — ED Notes (Signed)
Attempted Report x1.   

## 2016-10-20 NOTE — ED Notes (Signed)
Cardiology at the bedside.

## 2016-10-20 NOTE — ED Triage Notes (Signed)
BIB EMS from home, LKW 0000, at 0330 family noticed pt had slurred speech, R sided facial droop and R sided weakness. Pt just DC yesterday evening from hospital for stroke. Pt received tPA during that admission. Afib en route, HR 120's-180's.

## 2016-10-20 NOTE — ED Notes (Signed)
Pt returned from MRI °

## 2016-10-20 NOTE — Consult Note (Signed)
Consult- ** Code Stroke **  Code Stroke Page: 0416 hrs ER arrival: 0435 hrs MD at bedside: 0435 hrs  TLKN: (10/19/16) NIHSS: 9 TPA: No (outside window) Thrombectomy: No (No LVO) mRS (baseline): 0     Chief Complaint: stroke (Code Stroke)  HPI: Alexa Fox is an 76 y.o. female with h/o AFib (Coumadin-subtherap INR), DM, HTN, and recent TIA (discharged 10/19/16) seen as code stroke.  She was recently admitted/discharged (7/10- 10/19/16) for stroke (aphasia/ Rt wkness-? Embolic-s/p tPA) with complete recovery.  During her stay, she underwent testing (see below) and was discharged with a NF neurological exam on ASA/Warfarin (INR 1.68 at discharge).  According to dtrs she had complained of a headache once home, but went to bed around midnight last PM.  She then got up around 0330 hrs this AM to go to the bathroom, but ran into a doorframe.  The noise awoke the dtrs who then found her unable to speak with right face/arm weakness.  She was taken by EMS to [facility] and noted to be in AFib (HR 120- 180's enroute).  In ER BP 124/74, with noted expressive aphasia and Rt UE weakness (NIHSS 9).  She underwent a Head CT (subtle loss of grey-white matter at Lt frontal operculum and Lt posterior frontal region) and CTA H/N (negative).  INR 1.46 noted.  Pt re-examined later and seemed to be speaking better.  She will be admitted for further evalaution  Prior Testing (7/10- 7/12):  CT head: Question subtle evolving hypodensity within the left occipital pole, suspicious for acute left PCA territory infarct  MRIhead: IMPRESSION: 1. No acute intracranial infarct or other process identified. 2. Generalized age-related cerebral atrophy with mild chronic small vessel ischemic disease and scattered remote infarcts as above.  MRAheadnot ordered  CarotidDoppler: B ICA 1-39% stenosis, VAs antegrade  2D Echopending    Past Medical History:  Diagnosis Date  . A-fib (HCC)   . Diabetes mellitus without  complication (HCC)   . Hypertension   . Stroke [facility])     Past Surgical History:  Procedure Laterality Date  . ABDOMINAL HYSTERECTOMY    . CHOLECYSTECTOMY      No family history on file. Social History:  reports that she has never smoked. She has never used smokeless tobacco. She reports that she does not drink alcohol or use drugs.  Allergies:  Allergies  Allergen Reactions  . Eggs Or Egg-Derived Products     Reaction unknown to patient/family     (Not in a hospital admission)  ROS: As per HPI  Physical Examination: Height 5\' 2"  (1.575 m), weight 84.9 kg (187 lb 2.7 oz).  Vitals:   10/20/16 0730 10/20/16 0745  BP: 112/65 125/70  Pulse: 92 95  Resp: (!) 21 20    HEENT-  Normocephalic, no lesions, without obvious abnormality.  Normal external eye and conjunctiva.  Normal TM's bilaterally.  Normal auditory canals and external ears. Normal external nose, mucus membranes and septum.  Normal pharynx. Neck supple with no masses, nodes, nodules or enlargement. Cardiovascular - irregularly irregular rhythm Lungs - chest clear, no wheezing, rales, normal symmetric air entry, Heart exam - S1, S2 normal, no murmur, no gallop, rate regular Abdomen - soft, non-tender; bowel sounds normal; no masses,  no organomegaly Extremities - no skin discoloration  Neurologic Examination:  Patient is alert with noted expressive aphasia. Mild distress.  She can follow simple commands.  EOMI (no gaze preference), VFFC, PERRL.  Face w/ Rt facial weakness with normal sensation.  Tongue protrudes midline, otherwise CN's II-XII grossly  intact. Motor: Able to hold Lt A/L and Rt leg vs gravity x 10 sec.  Unable to sustain Rt UE vs gravity.  Sensation:  Appears to have symmetric sensation to touch (no definite neglect).  Cerebellar:  Normal finger to nose w/ Lt UE, but very ataxic with Rt UE.    DTR's 1+ with toes downgoing   Results for orders placed or performed during the hospital encounter of  10/20/16 (from the past 48 hour(s))  CBG monitoring, ED     Status: Abnormal   Collection Time: 10/20/16  4:40 AM  Result Value Ref Range   Glucose-Capillary 127 (H) 65 - 99 mg/dL  Protime-INR     Status: Abnormal   Collection Time: 10/20/16  4:41 AM  Result Value Ref Range   Prothrombin Time 17.8 (H) 11.4 - 15.2 seconds   INR 1.46   APTT     Status: None   Collection Time: 10/20/16  4:41 AM  Result Value Ref Range   aPTT 34 24 - 36 seconds  CBC     Status: Abnormal (Preliminary result)   Collection Time: 10/20/16  4:41 AM  Result Value Ref Range   WBC 10.4 4.0 - 10.5 K/uL   RBC 3.73 (L) 3.87 - 5.11 MIL/uL   Hemoglobin 11.6 (L) 12.0 - 15.0 g/dL   HCT 40.935.1 (L) 81.136.0 - 91.446.0 %   MCV 94.1 78.0 - 100.0 fL   MCH 31.1 26.0 - 34.0 pg   MCHC 33.0 30.0 - 36.0 g/dL   RDW 78.213.0 95.611.5 - 21.315.5 %   Platelets PENDING 150 - 400 K/uL  Differential     Status: Abnormal   Collection Time: 10/20/16  4:41 AM  Result Value Ref Range   Neutrophils Relative % 81 %   Neutro Abs 8.5 (H) 1.7 - 7.7 K/uL   Lymphocytes Relative 13 %   Lymphs Abs 1.4 0.7 - 4.0 K/uL   Monocytes Relative 5 %   Monocytes Absolute 0.5 0.1 - 1.0 K/uL   Eosinophils Relative 1 %   Eosinophils Absolute 0.1 0.0 - 0.7 K/uL   Basophils Relative 0 %   Basophils Absolute 0.0 0.0 - 0.1 K/uL  I-stat troponin, ED ([facility])     Status: None   Collection Time: 10/20/16  4:50 AM  Result Value Ref Range   Troponin i, poc 0.01 0.00 - 0.08 ng/mL   Comment 3            Comment: Due to the release kinetics of cTnI, a negative result within the first hours of the onset of symptoms does not rule out myocardial infarction with certainty. If myocardial infarction is still suspected, repeat the test at appropriate intervals.   I-Stat Chem 8, ED  ([facility])     Status: Abnormal   Collection Time: 10/20/16  4:52 AM  Result Value Ref Range   Sodium 142 135 - 145 mmol/L   Potassium 4.4 3.5 - 5.1 mmol/L   Chloride 107 101 - 111  mmol/L   BUN 10 6 - 20 mg/dL   Creatinine, Ser 0.860.70 0.44 - 1.00 mg/dL   Glucose, Bld 578140 (H) 65 - 99 mg/dL   Calcium, Ion 4.691.17 6.291.15 - 1.40 mmol/L   TCO2 21 0 - 100 mmol/L   Hemoglobin 11.9 (L) 12.0 - 15.0 g/dL   HCT 52.835.0 (L) 41.336.0 - 24.446.0 %   Mr Brain Wo Contrast  Result Date: 10/19/2016  CLINICAL DATA:  Initial evaluation for acute right-sided weakness with aphasia. EXAM: MRI HEAD WITHOUT CONTRAST TECHNIQUE: Multiplanar, multiecho pulse sequences of the brain and surrounding structures were obtained without intravenous contrast. COMPARISON:  Comparison made with prior CT from 10/17/2016 as well as earlier the same day. FINDINGS: Brain: Diffuse prominence of the CSF containing spaces is compatible with generalized age-related cerebral atrophy. Remote lacunar infarcts present within the right basal ganglia. Multiple scatter remote bilateral cerebellar infarcts present as well. Remote cortical infarct with associated chronic blood products present within the left parietal lobe. Minimal for age chronic small vessel ischemic disease. No abnormal foci of restricted diffusion to suggest acute or subacute ischemia. Gray-white matter differentiation maintained. No evidence for acute intracranial hemorrhage. No mass lesion, midline shift or mass effect. No hydrocephalus. No extra-axial fluid collection. Major dural sinuses are grossly patent. Pituitary gland suprasellar region within normal limits. Vascular: Major intracranial vascular flow voids are maintained. Skull and upper cervical spine: Craniocervical junction within normal limits. Visualized upper cervical spine unremarkable. Bone marrow signal intensity within normal limits. No scalp soft tissue abnormality. Sinuses/Orbits: Globes and orbital soft tissues within normal limits. Patient status post lens extraction bilaterally. Paranasal sinuses are clear. No mastoid effusion. Inner ear structures normal. IMPRESSION: 1. No acute intracranial infarct or other  process identified. 2. Generalized age-related cerebral atrophy with mild chronic small vessel ischemic disease and scattered remote infarcts as above. Electronically Signed   By: Rise Mu M.D.   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 female with h/o AFib (Coumadin-subtherap INR), DM, HTN, and recent TIA (discharged 10/19/16) seen as code stroke.  She was recently admitted/discharged for a Lt MCA stroke vs TIA (s/p tPA), but now represents with similar expressive aphasia and Rt UE weakness, outside tPA window, and without identifiable LVO.  She has notable expressive aphasia with Rt UE weakness on exam (NIHSS 9).  Suspect embolic event given subtherapeutic INR on evaluation which likely has involved an M3 branch in [place] region.  She is not a tPA candidate (outside window), nor an interventional candidate (no LVO).   RECC: Stroke management Recommendations:     a) Vitals/ Clinical:   1) Neurochecks q 1 hr x 24 hrs, then q 2 hrs x 24 hrs, then q 4hrs if remains stable  2) Monitor I&O- goal euvolemia  3) Telemetry     b) BP: Permissive hypertension (no tx unless SBP > 220 mm Hg or DBP > 120 mm Hg).  If BP treatment necessary, then should be                done cautiously by approximately 15% during first 24 hours after stroke onset     c) Rx:   1) ASA 81 mg qd.  Hold anticoagulation for now until can define size of stroke by Head MRI  2) Statin and FLP  3) Fall precautions  3) DVT prevention with SCD's and SQ Heparin     d) Labs  1) Routine labs- Avoid hyperglycemia  2) HbA1C     e) Testing:   1) Head MRI (no Gad)  2) will forgo Echo at this time since recently had this study, and doubt will change management  3) Head CT PRN neurological changes- monitor for acute bleed  4) screen for sleep apnea (AFib, stroke, large neck). Consider outpt split night  sleep study after  discharge     g) Other:  1) Avoid/correct factors that can exacerbate/worsen stroke: dehydration, hypo (< 60 mg/dL) /hyperglycemia (target serum glucose       of 140-180 mg/dl), fever/ infection, anemia  2) HOB 30 degrees if at risk for aspiration or cardiopulmonary decompensation/oxygen desaturation.  Otherwise, adjust HOB       to patient comfort  3) watch for cardiac changes: EKG (ST segment depression, QT prolongation), Echo (LV dysfunction), structural (Troponins)  4) PT, OT, ST (NPO til ST eval)  5) stool softeners, analgesia, GI ulcer prophylaxis  6) vigilance for common medical complications of stroke: Depression, MI, heart failure, dysphagia/ aspiration pneumonia, UTI,      DVT, Pulmonary embolism, dehydration, malnutrition, pressure sores, contractures  7) stroke education  8) full d/w dtrs at bedside  9) Neurology to follow     Beryle Beams, M.D. Neurology Beeper: [phone] 10/20/2016, 5:18 AM

## 2016-10-20 NOTE — ED Notes (Signed)
Pt in MRI. Walked over to MRI to D/c heparin.

## 2016-10-21 ENCOUNTER — Inpatient Hospital Stay (HOSPITAL_COMMUNITY): Payer: Medicare Other

## 2016-10-21 DIAGNOSIS — I639 Cerebral infarction, unspecified: Secondary | ICD-10-CM

## 2016-10-21 DIAGNOSIS — D649 Anemia, unspecified: Secondary | ICD-10-CM

## 2016-10-21 DIAGNOSIS — E119 Type 2 diabetes mellitus without complications: Secondary | ICD-10-CM

## 2016-10-21 DIAGNOSIS — I482 Chronic atrial fibrillation: Secondary | ICD-10-CM

## 2016-10-21 DIAGNOSIS — R4701 Aphasia: Secondary | ICD-10-CM

## 2016-10-21 LAB — GLUCOSE, CAPILLARY
GLUCOSE-CAPILLARY: 91 mg/dL (ref 65–99)
Glucose-Capillary: 104 mg/dL — ABNORMAL HIGH (ref 65–99)
Glucose-Capillary: 126 mg/dL — ABNORMAL HIGH (ref 65–99)
Glucose-Capillary: 139 mg/dL — ABNORMAL HIGH (ref 65–99)

## 2016-10-21 LAB — HEPARIN LEVEL (UNFRACTIONATED): Heparin Unfractionated: 0.47 IU/mL (ref 0.30–0.70)

## 2016-10-21 LAB — HEPARIN INDUCED PLATELET AB (HIT ANTIBODY): HEPARIN INDUCED PLT AB: 0.117 {OD_unit} (ref 0.000–0.400)

## 2016-10-21 MED ORDER — HEPARIN (PORCINE) IN NACL 100-0.45 UNIT/ML-% IJ SOLN
950.0000 [IU]/h | INTRAMUSCULAR | Status: DC
  Administered 2016-10-21: 950 [IU]/h via INTRAVENOUS
  Filled 2016-10-21 (×2): qty 250

## 2016-10-21 MED ORDER — WARFARIN SODIUM 5 MG PO TABS
5.0000 mg | ORAL_TABLET | ORAL | Status: AC
  Administered 2016-10-21: 5 mg via ORAL
  Filled 2016-10-21: qty 1

## 2016-10-21 MED ORDER — WARFARIN - PHARMACIST DOSING INPATIENT: Freq: Every day | Status: DC

## 2016-10-21 MED ORDER — PANTOPRAZOLE SODIUM 40 MG PO TBEC
40.0000 mg | DELAYED_RELEASE_TABLET | Freq: Every day | ORAL | Status: DC
  Administered 2016-10-22 – 2016-10-23 (×2): 40 mg via ORAL
  Filled 2016-10-21 (×2): qty 1

## 2016-10-21 NOTE — Plan of Care (Signed)
Problem: Fluid Volume: Goal: Ability to maintain a balanced intake and output will improve Outcome: Progressing Patient currently has NS at 14610ml/hr infusing continuously. Patient consumed 25% of breakfast tray with assistance from family members at bedside.

## 2016-10-21 NOTE — Progress Notes (Signed)
Patient resting in chairside with call light in reach.  No complaints of pain or discomfort.  Family supportive and at bedside.

## 2016-10-21 NOTE — Progress Notes (Signed)
  Speech Language Pathology Treatment: Dysphagia  Patient Details Name: Alexa Fox MRN: [id number] DOB: 06/09/1940 Today's Date: 10/21/2016 Time: [id number] SLP Time Calculation (min) (ACUTE ONLY): 16 min  Assessment / Plan / Recommendation Clinical Impression  Patient seen for dysphagia treatment. Multiple family members present; daughter states she assisted pt with eating breakfast this morning and noticed "she kept having grits on her tongue." SLP provided education re: impacts of right sided cranial nerve deficits on oral stage of swallowing as well as risks for aspiration. Provided skilled education to patient and family including description and demonstration of techniques to improve swallowing safety including presentation of bolus to left side of oral cavity vs right and lingual sweep for clearance of pocketing. Pt required consistent mod verbal and tactile cues for left-sided placement of boluses of pureed solids, ice chips, as well as cues for lingual sweep for clearance of mild R pocketing. Anterior loss of ice chip from right side of oral cavity x1. No overt signs of aspiration noted. Recommend continuing current diet of dys 1 with thin liquids, medications crushed in puree. SLP will f/u for tolerance, training in compensatory techniques and advancement as appropriate.    HPI HPI: 76 y.o.femalewith medical history significant for Atrial Fibrillation, DM, HTN, history of TIA recently hospitalized at Neuro ICU from 7/11 to 7/12 for the treatment of acute CVA requiring tPA at 0016 hrs on 7/11, leaving to home without residual symptoms, returning to the hospital after waking up at 3:30 with slurred speech, R facial droop and R arm weakness. MRI with 76 y.o.femalewith medical history significant for Atrial Fibrillation, DM, HTN, history of TIA recently hospitalized at Neuro ICU from 7/11 to 7/12 for the treatment of acute CVA requiring tPA at 0016 hrs on 7/11, leaving to home without  residual symptoms, returning to the hospital after waking up at 3:30 with slurred speech, R facial droop and R arm weakness. 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      SLP Plan  Continue with current plan of care       Recommendations  Diet recommendations: Dysphagia 1 (puree);Thin liquid Liquids provided via: Cup Medication Administration: Crushed with puree Supervision: Full supervision/cueing for compensatory strategies Compensations: Slow rate;Small sips/bites;Lingual sweep for clearance of pocketing Postural Changes and/or Swallow Maneuvers: Seated upright 90 degrees                Oral Care Recommendations: Oral care BID Follow up Recommendations: Inpatient Rehab SLP Visit Diagnosis: Dysphagia, oral phase (R13.11) Plan: Continue with current plan of care       GO              Rondel BatonMary Beth Anyela Napierkowski, MS, CCC-SLP Speech-Language Pathologist [phone]  Alexa Fox 10/21/2016, 11:44 AM

## 2016-10-21 NOTE — Progress Notes (Signed)
[facility] PROGRESS NOTE  Alexa Fox ONG:[id number] DOB: 1941-02-13 DOA: 10/20/2016  PCP: System, Pcp Not In  Brief History/Interval Summary: 76 year old Caucasian female with a past medical history of atrial fibrillation, diabetes, essential hypertension, history of TIA recently hospitalized for acute stroke requiring TPA. On 7/11. She was discharged home by neurology without any residual symptoms. She presented back to the hospital on the day of admission after waking up with slurred speech, right facial droop and right arm weakness. She was hospitalized for further management.  Reason for Visit: Acute stroke  Consultants: Neurology  Procedures: None  Antibiotics: None  Subjective/Interval History: Patient states that she is better this morning. Her speech has improved facial droop has improved. Strength in the right arm has also improved, however, continues to have weakness. Her daughter agrees that some of the symptoms have improved. Patient denies any new complaints.  ROS: No chest pain or shortness of breath. No nausea or vomiting  Objective:  Vital Signs  Vitals:   10/21/16 0200 10/21/16 0300 10/21/16 0742 10/21/16 0900  BP: (!) 91/46 (!) 112/59 126/68 (!) 119/53  Pulse: 81 77 97 77  Resp: (!) 24 (!) 21 19 (!) 21  Temp:  99.5 F (37.5 C) 98.6 F (37 C)   TempSrc:  Oral Axillary   SpO2: (!) 86% 97% 100% 98%  Weight:      Height:        Intake/Output Summary (Last 24 hours) at 10/21/16 1143 Last data filed at 10/21/16 1000  Gross per 24 hour  Intake             2330 ml  Output             1125 ml  Net             1205 ml   Filed Weights   10/20/16 0457 10/20/16 1615  Weight: 84.9 kg (187 lb 2.7 oz) 81.6 kg (179 lb 14.3 oz)    General appearance: alert, cooperative, appears stated age and no distress Head: Normocephalic, without obvious abnormality, atraumatic Resp: clear to auscultation bilaterally Cardio: S1, S2 is irregularly irregular. No  S3, S4. No rubs, murmurs or bruits GI: soft, non-tender; bowel sounds normal; no masses,  no organomegaly Extremities: extremities normal, atraumatic, no cyanosis or edema Neurologic: Awake and alert. Oriented 3. Subtle right facial droop is noted. Her speech is appreciated. Finger to nose is weak on the right. Mild pronator drift is present.  Lab Results:  Data Reviewed: I have personally reviewed following labs and imaging studies  CBC:  Recent Labs Lab 10/17/16 2245 10/18/16 0428 10/20/16 0441 10/20/16 0452  WBC 9.0 9.7 10.4  --   NEUTROABS 5.8  --  8.5*  --   HGB 13.3 12.5 11.6* 11.9*  HCT 39.3 38.6 35.1* 35.0*  MCV 92.9 93.5 94.1  --   PLT 185 140* 118*  --     Basic Metabolic Panel:  Recent Labs Lab 10/17/16 2245 10/18/16 0428 10/20/16 0441 10/20/16 0452  NA 138 140 136 142  K 3.8 4.1 4.3 4.4  CL 102 109 111 107  CO2 28 20* 22  --   GLUCOSE 171* 126* 139* 140*  BUN 29* 24* 9 10  CREATININE 1.04* 0.92 0.81 0.70  CALCIUM 9.3 8.6* 8.7*  --     GFR: Estimated Creatinine Clearance: 58.3 mL/min (by C-G formula based on SCr of 0.7 mg/dL).  Liver Function Tests:  Recent Labs Lab 10/17/16 2245 10/20/16 0441  AST  22 17  ALT 23 16  ALKPHOS 55 43  BILITOT 0.5 1.2  PROT 7.1 5.7*  ALBUMIN 4.0 3.6     Coagulation Profile:  Recent Labs Lab 10/17/16 2245 10/19/16 1211 10/20/16 0441  INR 1.55 1.68 1.46    Cardiac Enzymes:  Recent Labs Lab 10/19/16 0635 10/19/16 1101 10/20/16 0731 10/20/16 1615 10/20/16 1920  TROPONINI <0.03 <0.03 <0.03 <0.03 <0.03    CBG:  Recent Labs Lab 10/20/16 0755 10/20/16 1159 10/20/16 1636 10/20/16 2204 10/21/16 0803  GLUCAP 121* 100* 90 104* 104*    Lipid Profile:  Recent Labs  10/20/16 1615  CHOL 140  HDL 52  LDLCALC 72  TRIG 80  CHOLHDL 2.7    Anemia Panel:  Recent Labs  10/20/16 0915 10/20/16 0945  VITAMINB12 255  --   FOLATE  --  17.8  FERRITIN 73  --   TIBC 267  --   IRON 28  --     RETICCTPCT 1.3  --     Recent Results (from the past 240 hour(s))  MRSA PCR Screening     Status: None   Collection Time: 10/18/16  1:56 AM  Result Value Ref Range Status   MRSA by PCR NEGATIVE NEGATIVE Final    Comment:        The GeneXpert MRSA Assay (FDA approved for NASAL specimens only), is one component of a comprehensive MRSA colonization surveillance program. It is not intended to diagnose MRSA infection nor to guide or monitor treatment for MRSA infections.   MRSA PCR Screening     Status: None   Collection Time: 10/20/16  4:18 PM  Result Value Ref Range Status   MRSA by PCR NEGATIVE NEGATIVE Final    Comment:        The GeneXpert MRSA Assay (FDA approved for NASAL specimens only), is one component of a comprehensive MRSA colonization surveillance program. It is not intended to diagnose MRSA infection nor to guide or monitor treatment for MRSA infections.       Radiology Studies: Ct Angio Head W Or Wo Contrast  Result Date: 10/20/2016 CLINICAL DATA:  Initial evaluation for acute stroke, right-sided weakness, slurred speech. EXAM: CT ANGIOGRAPHY HEAD AND NECK TECHNIQUE: Multidetector CT imaging of the head and neck was performed using the standard protocol during bolus administration of intravenous contrast. Multiplanar CT image reconstructions and MIPs were obtained to evaluate the vascular anatomy. Carotid stenosis measurements (when applicable) are obtained utilizing NASCET criteria, using the distal internal carotid diameter as the denominator. CONTRAST:  50 cc of Isovue 370. COMPARISON:  Prior CT from earlier same day as well as recent CTA from 10/18/2016. FINDINGS: CTA NECK FINDINGS Aortic arch: Aortic arch of normal caliber with normal 3 vessel morphology. No flow-limiting stenosis about the origin of the great vessels. Visualized subclavian artery is widely patent. Right carotid system: Right common and internal carotid artery's widely patent without  stenosis, dissection, or occlusion. No significant atheromatous narrowing about the right carotid bifurcation. Left carotid system: Left common and internal carotid artery's are widely patent without stenosis, dissection, or occlusion. Mild calcified plaque at the proximal left ICA without stenosis. Vertebral arteries: Both of the vertebral arteries arise from the subclavian arteries. Vertebral arteries patent within the neck without stenosis, dissection, or occlusion. Skeleton: No acute osseus abnormality. No worrisome lytic or blastic osseous lesions. Moderate degenerative spondylolysis at C4-5 through C6-7. Other neck: No acute soft tissue abnormality within the neck. Salivary glands normal. Thyroid normal. No adenopathy. Upper chest:  Small layering bilateral pleural effusions partially visualized. Associated atelectasis. Emphysema. Review of the MIP images confirms the above findings CTA HEAD FINDINGS Anterior circulation: Petrous segments widely patent bilaterally. Cavernous and supraclinoid segments widely patent bilaterally without flow-limiting stenosis. Mild scattered atheromatous plaque noted within the cavernous right ICA. ICA termini patent. A1 segments patent. Anterior communicating artery normal. Anterior cerebral arteries patent to their distal aspects. M1 segments patent without stenosis or occlusion. No proximal M2 occlusion. Distal MCA branches well opacified and symmetric. Posterior circulation: Vertebral artery's patent to the vertebrobasilar junction without stenosis. Patent left PICA. Right PICA not visualized. Basilar artery widely patent. Superior cerebral arteries patent bilaterally. Both of the posterior cerebral arteries primarily supplied via the basilar and are patent to their distal aspects. Venous sinuses: Patent. Anatomic variants: No significant anatomic variant. No aneurysm or vascular malformation. Delayed phase: Not performed. Review of the MIP images confirms the above findings  IMPRESSION: 1. Negative CTA for emergent large vessel occlusion. 2. Stable exam with mild atherosclerotic changes for patient age. No high-grade or flow-limiting stenosis. 3. Small layering bilateral pleural effusions with associated atelectasis. Critical Value/emergent results were called by telephone at the time of interpretation on 10/20/2016 at 5:42 am to Dr. Levander Campion, who verbally acknowledged these results. Electronically Signed   By: Rise Mu M.D.   On: 10/20/2016 06:04   Ct Angio Neck W And/or Wo Contrast  Result Date: 10/20/2016 CLINICAL DATA:  Initial evaluation for acute stroke, right-sided weakness, slurred speech. EXAM: CT ANGIOGRAPHY HEAD AND NECK TECHNIQUE: Multidetector CT imaging of the head and neck was performed using the standard protocol during bolus administration of intravenous contrast. Multiplanar CT image reconstructions and MIPs were obtained to evaluate the vascular anatomy. Carotid stenosis measurements (when applicable) are obtained utilizing NASCET criteria, using the distal internal carotid diameter as the denominator. CONTRAST:  50 cc of Isovue 370. COMPARISON:  Prior CT from earlier same day as well as recent CTA from 10/18/2016. FINDINGS: CTA NECK FINDINGS Aortic arch: Aortic arch of normal caliber with normal 3 vessel morphology. No flow-limiting stenosis about the origin of the great vessels. Visualized subclavian artery is widely patent. Right carotid system: Right common and internal carotid artery's widely patent without stenosis, dissection, or occlusion. No significant atheromatous narrowing about the right carotid bifurcation. Left carotid system: Left common and internal carotid artery's are widely patent without stenosis, dissection, or occlusion. Mild calcified plaque at the proximal left ICA without stenosis. Vertebral arteries: Both of the vertebral arteries arise from the subclavian arteries. Vertebral arteries patent within the neck without  stenosis, dissection, or occlusion. Skeleton: No acute osseus abnormality. No worrisome lytic or blastic osseous lesions. Moderate degenerative spondylolysis at C4-5 through C6-7. Other neck: No acute soft tissue abnormality within the neck. Salivary glands normal. Thyroid normal. No adenopathy. Upper chest: Small layering bilateral pleural effusions partially visualized. Associated atelectasis. Emphysema. Review of the MIP images confirms the above findings CTA HEAD FINDINGS Anterior circulation: Petrous segments widely patent bilaterally. Cavernous and supraclinoid segments widely patent bilaterally without flow-limiting stenosis. Mild scattered atheromatous plaque noted within the cavernous right ICA. ICA termini patent. A1 segments patent. Anterior communicating artery normal. Anterior cerebral arteries patent to their distal aspects. M1 segments patent without stenosis or occlusion. No proximal M2 occlusion. Distal MCA branches well opacified and symmetric. Posterior circulation: Vertebral artery's patent to the vertebrobasilar junction without stenosis. Patent left PICA. Right PICA not visualized. Basilar artery widely patent. Superior cerebral arteries patent bilaterally. Both of the posterior cerebral  arteries primarily supplied via the basilar and are patent to their distal aspects. Venous sinuses: Patent. Anatomic variants: No significant anatomic variant. No aneurysm or vascular malformation. Delayed phase: Not performed. Review of the MIP images confirms the above findings IMPRESSION: 1. Negative CTA for emergent large vessel occlusion. 2. Stable exam with mild atherosclerotic changes for patient age. No high-grade or flow-limiting stenosis. 3. Small layering bilateral pleural effusions with associated atelectasis. Critical Value/emergent results were called by telephone at the time of interpretation on 10/20/2016 at 5:42 am to Dr. Levander Campion, who verbally acknowledged these results. Electronically  Signed   By: Rise Mu M.D.   On: 10/20/2016 06:04   Mr Brain Wo Contrast  Result Date: 10/20/2016 CLINICAL DATA:  Slurred speech and right arm weakness. EXAM: MRI HEAD WITHOUT CONTRAST TECHNIQUE: Multiplanar, multiecho pulse sequences of the brain and surrounding structures were obtained without intravenous contrast. COMPARISON:  Brain MRI from 2 days ago FINDINGS: Brain: Interval restricted diffusion in the left frontal parietal cortex, involving the post more than precentral gyrus. Remote small left parietal cortically based infarct. Remote small vessel infarcts in the right caudate nucleus and bilateral cerebellum. Microvascular ischemic gliosis is mild. Chronic blood products associated with remote left parietal infarct. No acute hemorrhage, hydrocephalus, or masslike findings. Vascular: Major flow voids are preserved. Preceding CTA earlier today. Skull and upper cervical spine: Negative for marrow lesion. Sinuses/Orbits: Bilateral cataract resection.  No acute finding. IMPRESSION: 1. Moderate acute infarct affecting the left post more than precentral gyri. 2. 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 Electronically Signed   By: Marnee Spring M.D.   On: 10/20/2016 10:07   Dg Chest Port 1 View  Result Date: 10/21/2016 CLINICAL DATA:  Hx of afib, DM, HTN, TIA. Never smoker. EXAM: PORTABLE CHEST 1 VIEW COMPARISON:  10/20/2016 FINDINGS: Cardiac silhouette is mildly enlarged. No mediastinal or hilar masses. Vascular congestion noted previously has improved. Lungs are now essentially clear. No convincing pleural effusion.  No pneumothorax. IMPRESSION: 1. Improved lung aeration since the prior exam. No evidence of pneumonia or pulmonary edema. Electronically Signed   By: Amie Portland M.D.   On: 10/21/2016 08:07   Dg Chest Portable 1 View  Result Date: 10/20/2016 CLINICAL DATA:  76 year old female with shortness of breath and vomiting.  Hypoxia. EXAM: PORTABLE CHEST 1 VIEW COMPARISON:  None FINDINGS: Mild cardiomegaly with mild vascular congestion. Minimal bibasilar atelectatic changes noted. No focal consolidation, pleural effusion, or pneumothorax. No acute osseous pathology. IMPRESSION: Mild cardiomegaly with vascular congestion.  No focal consolidation. Electronically Signed   By: Elgie Collard M.D.   On: 10/20/2016 06:45   Ct Head Code Stroke W/o Cm  Addendum Date: 10/20/2016   ADDENDUM REPORT: 10/20/2016 07:04 ADDENDUM: Results were discussed by telephone with Dr. Levander Campion at approximately 5:30 a.m. on 10/20/2016. Additionally, there is a dictation in the initial report. Dr. Olin Pia was paged at 5:12 a.m. on 10/20/2016 (not 08/20/2016). Electronically Signed   By: Rise Mu M.D.   On: 10/20/2016 07:04   Result Date: 10/20/2016 CLINICAL DATA:  Code stroke. Initial evaluation for acute right-sided weakness, slurred speech. EXAM: CT HEAD WITHOUT CONTRAST TECHNIQUE: Contiguous axial images were obtained from the base of the skull through the vertex without intravenous contrast. COMPARISON:  Prior MRI from 10/18/2016. FINDINGS: Brain: Stable atrophy with chronic small vessel ischemic disease. Remote lacunar infarct within the right basal ganglia. Scatter remote bilateral cerebellar infarcts, with additional remote posterior left  MCA territory infarct. No acute intracranial hemorrhage. There is subtle loss of gray-white matter differentiation at the left frontal operculum and supra ganglionic posterior left frontal lobe (series 6, image 48), suspicious for evolving acute ischemic left MCA territory infarct. Left insula relatively maintained at this time. Basal ganglia and internal capsule maintained. No other acute evolving large vessel territory infarct. No mass lesion or midline shift. No hydrocephalus. No extra-axial fluid collection. Vascular: No hyperdense vessel. Skull: Scalp soft tissues and calvarium within normal  limits. Sinuses/Orbits: Globes and orbital soft tissues within normal limits. Patient status post lens extraction bilaterally. Paranasal sinuses and mastoids are clear. ASPECTS [facility] Stroke Program Early CT Score) - Ganglionic level infarction (caudate, lentiform nuclei, internal capsule, insula, M1-M3 cortex): 6 - Supraganglionic infarction (M4-M6 cortex): 2 Total score (0-10 with 10 being normal): 8 IMPRESSION: 1. Subtle loss of gray-white matter differentiation at the left frontal operculum and posterior left frontal region as above, suspicious for acute ischemic left MCA territory infarct. No intracranial hemorrhage. 2. ASPECTS is 8. 3. Stable atrophy with scattered remote infarcts as above. Dr. Olin Pia, of the Stroke Neurology service has been paged regarding these findings at 5:12 a.m. on 08/20/2016. Currently awaiting a call back. Results will be conveyed as soon as possible. Electronically Signed: By: Rise Mu M.D. On: 10/20/2016 05:17     Medications:  Scheduled: .  stroke: mapping our early stages of recovery book   Does not apply Once  . atorvastatin  40 mg Oral q1800  . docusate  100 mg Oral Daily  . feeding supplement (ENSURE ENLIVE)  237 mL Oral BID BM  . furosemide  20 mg Intravenous Daily  . insulin aspart  0-9 Units Subcutaneous TID WC  . pantoprazole (PROTONIX) IV  40 mg Intravenous Daily   Continuous: . 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 Presents back with worsening symptoms. Appears to have  a new stroke. She has a history of atrial fibrillation and is on warfarin. Neurology is following. They have recommended to reinitiate warfarin. Cardiology recommends INR close to 2.5. Recently completed all stroke workup and neurology does not have any further recommendations. Await PT, OT, speech evaluation. Continue statin.  Essential hypertension. Stable. Allow permissive hypertension.  Hyperlipidemia. LDL 86. Continue statin.  Atrial fibrillation. She needs to be on anticoagulation due to high chance to ask the score is 7. 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 Seen by cardiology. Recommend INR close to 2.5. Rate is reasonably well controlled. Did have an episode of RVR when she was in the emergency department. Resume her home medication as soon as able. She did have echocardiogram during her last hospitalization which showed normal systolic function. The she does have moderate mitral stenosis. Patient has a history of rheumatic fever. Patient mentioned that she's had problems controlling her INR the last 6 weeks. She is originally from [place] and is visiting family in [place].   History of type 2 diabetes mellitus. Monitor CBGs. Sliding scale insulin coverage.  Mild thrombocytopenia. No evidence of bleeding. Counts are slightly  lower than what they were during her previous hospitalization. Etiology unclear. Continue to monitor.  Normocytic anemia. Mild drop in hemoglobin, likely dilutional. Continue to monitor. No evidence for bleeding.   DVT Prophylaxis: Warfarin    Code Status: Full code  Family Communication: Discussed with the patient and her daughter  Disposition Plan: Await PT, OT evaluation. Patient is medically stable. Okay for transfer to the floor.    LOS: 1 day   [facility] TucsonKRISHNAN,Santia Labate  [facility] Pager [phone] 10/21/2016, 11:43 AM  If 7PM-7AM, please contact night-coverage at [email or web address], password [email or web address]

## 2016-10-21 NOTE — Progress Notes (Signed)
Patient transferred to [id number] with all belongings. VSS. Grand-daughter at bedside. Patient left in recliner with call-bell in reach and 45M RN and NT in room with patient.

## 2016-10-21 NOTE — Progress Notes (Signed)
10/20/16 1300  SLP Visit Information  SLP Received On 10/20/16  General Information  HPI 76 y.o.femalewith medical history significant for Atrial Fibrillation, DM, HTN, history of TIA recently hospitalized at [facility] ICU from 7/11 to 7/12 for the treatment of acute CVA requiring tPA at 0016 hrs on 7/11, leaving to home without residual symptoms, returning to the hospital after waking up at 3:30 with slurred speech, R facial droop and R arm weakness. MRI with 76 y.o.femalewith medical history significant for Atrial Fibrillation, DM, HTN, history of TIA recently hospitalized at [facility] ICU from 7/11 to 7/12 for the treatment of acute CVA requiring tPA at 0016 hrs on 7/11, leaving to home without residual symptoms, returning to the hospital after waking up at 3:30 with slurred speech, R facial droop and R arm weakness. 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  Type of Study Bedside Swallow Evaluation  Previous Swallow Assessment 10/18/16-bedside swallow evaluation complete with recommendations for regular solids, thin liquids  Diet Prior to this Study NPO  Temperature Spikes Noted No  Respiratory Status Nasal cannula  History of Recent Intubation No  Behavior/Cognition Alert;Cooperative;Pleasant mood  Oral Cavity Assessment WFL  Oral Care Completed by SLP No  Oral Cavity - Dentition Dentures, top;Dentures, bottom  Vision Functional for self-feeding  Self-Feeding Abilities Able to feed self  Patient Positioning Upright in bed  Baseline Vocal Quality Low vocal intensity  Volitional Cough Weak  Volitional Swallow Unable to elicit  Oral Motor/Sensory Function  Overall Oral Motor/Sensory Function Moderate impairment  Facial ROM Reduced right;Suspected CN VII (facial) dysfunction  Facial Symmetry Abnormal symmetry right;Suspected CN VII (facial) dysfunction  Facial Strength Reduced right;Suspected CN VII (facial) dysfunction  Facial  Sensation Reduced right;Suspected CN V (Trigeminal) dysfunction  Lingual ROM WFL  Lingual Symmetry WFL  Lingual Strength WFL  Lingual Sensation WFL  Velum WFL  Mandible WFL  Ice Chips  Ice chips Impaired  Presentation Spoon  Oral Phase Impairments Reduced labial seal;Poor awareness of bolus (poor awareness of oral spillage)  Oral Phase Functional Implications Right anterior spillage  Thin Liquid  Thin Liquid WFL  Presentation Cup;Straw;Self Fed  Nectar Thick Liquid  Nectar Thick Liquid NT  Honey Thick Liquid  Honey Thick Liquid NT  Puree  Puree WFL  Presentation Spoon  Solid  Solid Impaired  Oral Phase Impairments Poor awareness of bolus;Impaired mastication  Oral Phase Functional Implications Right anterior spillage;Right lateral sulci pocketing;Prolonged oral transit;Impaired mastication;Oral residue  SLP - End of Session  Patient left in bed;with call bell/phone within reach;with family/visitor present  Nurse Communication Diet recommendation;Aspiration precautions reviewed;Swallow strategies reviewed  SLP Assessment  Clinical Impression Statement (ACUTE ONLY) Patient presents with what appears to be a primary oral dysphagia characterized by CN V and VII involvement impacting right facial sensation and strength. Anterior labial spillage noted and patient with decreased bolus cohesion, oral residuals, and prolonged oral transit with soft solids. Recommend initiation of pureed solids, thin liquids with close SLP f/u and monitoring from RN for s/s of aspiration. Patient is also at risk for silent aspiration given impairments.   SLP Visit Diagnosis Dysphagia, oral phase (R13.11)  Impact on safety and function Moderate aspiration risk  Swallow Evaluation Recommendations  SLP Diet Recommendations Dysphagia 1 (Puree);Thin liquid  Liquid Administration via Cup;Straw  Medication Administration Crushed with puree  Supervision Patient able to self feed;Full supervision/cueing for  compensatory strategies  Compensations Slow rate;Small sips/bites;Lingual sweep for clearance of pocketing  Postural Changes Seated  upright at 90 degrees  Treatment Plan  Oral Care Recommendations Oral care BID  Treatment Recommendations Therapy as outlined in treatment plan below  Follow up Recommendations Inpatient Rehab  Speech Therapy Frequency (ACUTE ONLY) min 2x/week  Treatment Duration 2 weeks  Interventions Aspiration precaution training;Compensatory techniques;Patient/family education;Trials of upgraded texture/liquids;Diet toleration management by SLP  Prognosis  Prognosis for Safe Diet Advancement Good  Individuals Consulted  Consulted and Agree with Results and Recommendations Patient;Family member/caregiver;RN  Family Member Consulted DAUGHTERS  SLP Time Calculation  SLP Start Time (ACUTE ONLY) 1345  SLP Stop Time (ACUTE ONLY) 1414  SLP Time Calculation (min) (ACUTE ONLY) 29 min  SLP Evaluations  $ SLP Speech Visit 1 Procedure  SLP Evaluations  $BSS Swallow 1 Procedure   Late entry for Ferdinand Lango, CCC-SLP on 10/20/16  Rondel Baton, MS, CCC-SLP Speech-Language Pathologist

## 2016-10-21 NOTE — Progress Notes (Signed)
Initial Nutrition Assessment  DOCUMENTATION CODES:  Not applicable  INTERVENTION:  Magic cup TID with meals, each supplement provides 290 kcal and 9 grams of protein   Gave education and multiple handouts to patients daughter regarding coumadin and vitamin K, Diabetes and general nutrition therapy for strokes.   Once discharge planning is underway, please re-consult RD to discuss a general healthy diet on low sodium and DM friendly.   NUTRITION DIAGNOSIS:  Inadequate oral intake related to poor appetite, dysphagia as evidenced by meal completion < 25%.  GOAL:  Patient will meet greater than or equal to 90% of their needs  MONITOR:  PO intake, Supplement acceptance, Labs, Diet advancement  REASON FOR ASSESSMENT:  Malnutrition Screening Tool    ASSESSMENT:  76 y/o female PMHx afib, DM, HTN, TIA. Recently hospitalized from 7/11-7/12 for treatment of CVA. Returned home and represented shortly after with new slurred speech, R facial droop and R arm weakness. Admitted for management of recurrent CVAs  Daughter is at bedside and provided majority of history.   Patient has had two back to back admissions for CVAs. Her first one did not cause any trouble swallowing and she ate 100% of the one meal that admission. This admision, however, her PO intake has been very poor. She has swallowing deficits and had been downgraded to D1 textures. The daughter thinks this is the main reason for the patients poor PO intake.   A ensure was also seen with a straw, but the patient had not consumed much. When asked directly about her poor intake and why she isnt eating, the patient just says "sometimes im hungry, sometimes Im not".  RD mentioned different supplements and foods that could be provided to the patient to help promote intake. However, the daughter interjected about many items over concerns about the patients blood sugars or her blood pressure. RD attempted to explain many times that in the acute  setting, goal is to maximize intake to help recover. A long term diet plan can be established as part of discharge planning. She still is very hesitant on magic cups (ice creams) or any item that has sodium. She thought the patients BG of 140 was way too high  RD answered a vast variety of nutritional questions that the daughter had. RD provided handouts for her to review and reiterated that a long term diet education session can be held when the patient can participate more.   Will order magic cups and continue to monitor PO intake  NFPE: WDL  Labs. Lipid panel was wdl and A1C was only in pre DM range. BGs now are well controlled Meds: Colace, Ensure, Warfarin, IVF   Recent Labs Lab 10/17/16 2245 10/18/16 0428 10/20/16 0441 10/20/16 0452  NA 138 140 136 142  K 3.8 4.1 4.3 4.4  CL 102 109 111 107  CO2 28 20* 22  --   BUN 29* 24* 9 10  CREATININE 1.04* 0.92 0.81 0.70  CALCIUM 9.3 8.6* 8.7*  --   GLUCOSE 171* 126* 139* 140*   Diet Order:  DIET - DYS 1 Room service appropriate? Yes; Fluid consistency: Thin  Skin:  Reviewed, no issues  Last BM:  7/13  Height:  Ht Readings from Last 1 Encounters:  10/20/16 5\' 2"  (1.575 m)   Weight:  Wt Readings from Last 1 Encounters:  10/20/16 179 lb 14.3 oz (81.6 kg)   Wt Readings from Last 10 Encounters:  10/20/16 179 lb 14.3 oz (81.6 kg)  10/18/16 174  lb 9.7 oz (79.2 kg)   Ideal Body Weight:  50 kg  BMI:  Body mass index is 32.9 kg/m.  Estimated Nutritional Needs:  Kcal:  1500-1650 (19-21 kcal/kg bw) Protein:  60-70 (1.2-1.4 g/kg ibw) Fluid:  1.5-1.7 L fluid (1 ml/kcal bw)  EDUCATION NEEDS:  Education needs addressed  Christophe Louis RD, LDN, CNSC Clinical Nutrition Pager: [phone] 10/21/2016 6:20 PM

## 2016-10-21 NOTE — Evaluation (Signed)
Physical Therapy Evaluation Patient Details Name: Alexa Fox MRN: [id number] DOB: 06/18/1940 Today's Date: 10/21/2016   History of Present Illness  Alexa BoucheSharon Jenkinsis a 76 y.o.femalewith a past medical history of A. fib, diabetes, hypertension, stroke who presents with acute onset right-sided weakness and aphasia.  Patient with Lt MCA stroke and Afib.      PMH:  recent hospitalization 10/17/16-10/19/16 with the same symptoms - tPA given.    Afib, DM, HTN, TIA, CVA (9 yrs ago)     Clinical Impression  Patient presents with problems listed below.  Will benefit from acute PT to maximize functional mobility prior to discharge. Per daughter, family would like to get family back to [place] as soon as they can safely.  Recommend HHPT for continued therapy and HH Aide to assist with ADL's at d/c - may need to start in [place].      Follow Up Recommendations Home health PT;Supervision for mobility/OO[facility](HH Aide)    Equipment Recommendations  Cane    Recommendations for Other Services       Precautions / Restrictions Precautions Precautions: Fall Restrictions Weight Bearing Restrictions: No      Mobility  Bed Mobility Overal bed mobility: Needs Assistance Bed Mobility: Supine to Sit;Sit to Supine     Supine to sit: Min guard Sit to supine: Min guard   General bed mobility comments: Increased time and effort to move to sitting.  No physical assist needed.  Transfers Overall transfer level: Needs assistance Equipment used: None Transfers: Sit to/from Stand Sit to Stand: Min guard         General transfer comment: Close guard for safety as pt powered up to full standing position. VC's for hand placement on seated surface for safety.   Ambulation/Gait Ambulation/Gait assistance: Min guard Ambulation Distance (Feet): 84 Feet Assistive device: None Gait Pattern/deviations: Step-through pattern;Decreased stride length;Shuffle Gait velocity: Decreased Gait velocity  interpretation: Below normal speed for age/gender General Gait Details: Slow, slightly unsteady gait.  Stairs            Wheelchair Mobility    Modified Rankin (Stroke Patients Only) Modified Rankin (Stroke Patients Only) Pre-Morbid Rankin Score: No significant disability Modified Rankin: Moderately severe disability     Balance Overall balance assessment: Needs assistance         Standing balance support: No upper extremity supported Standing balance-Leahy Scale: Fair                               Pertinent Vitals/Pain Pain Assessment: Faces Faces Pain Scale: Hurts little more Pain Location: Rt hand Pain Descriptors / Indicators: Aching;Sore Pain Intervention(s): Monitored during session;Repositioned    Home Living Family/patient expects to be discharged to:: Private residence (Dtr's home here prior to return to [place]) Living Arrangements: Alone Available Help at Discharge: Family;Available 24 hours/day (Daughter's home) Type of Home: House Home Access: Stairs to enter Entrance Stairs-Rails: Right Entrance Stairs-Number of Steps: 4/5 Home Layout: Two level Home Equipment: None Additional Comments: Per daughter, family wants pt to return to [place] ASAP and will move in with daughter there.  That home has only 1 step and is 1 level.    Prior Function Level of Independence: Independent         Comments: Never driven. Dtr provides transportation.     Hand Dominance   Dominant Hand: Right    Extremity/Trunk Assessment   Upper Extremity Assessment Upper Extremity Assessment: Defer to OT evaluation  Lower Extremity Assessment Lower Extremity Assessment: Generalized weakness;RLE deficits/detail RLE Deficits / Details: Only slightly weaker than LLE.  Grossly 4/5.       Communication   Communication: Expressive difficulties (Slurred at times. Soft-spoken)  Cognition Arousal/Alertness: Awake/alert Behavior During Therapy:  Impulsive;Flat affect Overall Cognitive Status: Impaired/Different from baseline Area of Impairment: Orientation;Following commands;Safety/judgement;Problem solving                 Orientation Level: Disoriented to;Time     Following Commands: Follows one step commands inconsistently;Follows one step commands with increased time Safety/Judgement: Decreased awareness of safety   Problem Solving: Slow processing;Difficulty sequencing;Requires verbal cues General Comments: Daughter trying to feed patient information at times when patient unable to answer.  Very impulsive, attempting to stand/ambulate prior to equipment being prepared.  Instructed patient to turn around at cart in hallway - patient walked past cart.      General Comments      Exercises     Assessment/Plan    PT Assessment Patient needs continued PT services  PT Problem List Decreased strength;Decreased activity tolerance;Decreased balance;Decreased mobility;Decreased cognition;Decreased knowledge of use of DME;Decreased safety awareness;Pain       PT Treatment Interventions DME instruction;Gait training;Stair training;Functional mobility training;Therapeutic activities;Therapeutic exercise;Balance training;Neuromuscular re-education;Cognitive remediation;Patient/family education    PT Goals (Current goals can be found in the Care Plan section)  Acute Rehab PT Goals Patient Stated Goal: None stated PT Goal Formulation: With patient/family Time For Goal Achievement: 10/28/16 Potential to Achieve Goals: Good    Frequency Min 3X/week   Barriers to discharge Decreased caregiver support Patient lives alone.  Will move in with daughter(s) at d/c and in [place]    Co-evaluation               AM-PAC PT "6 Clicks" Daily Activity  Outcome Measure Difficulty turning over in bed (including adjusting bedclothes, sheets and blankets)?: None Difficulty moving from lying on back to sitting on the side of the  bed? : A Little Difficulty sitting down on and standing up from a chair with arms (e.g., wheelchair, bedside commode, etc,.)?: A Little Help needed moving to and from a bed to chair (including a wheelchair)?: A Little Help needed walking in hospital room?: A Little Help needed climbing 3-5 steps with a railing? : A Lot 6 Click Score: 18    End of Session Equipment Utilized During Treatment: Gait belt Activity Tolerance: Patient tolerated treatment well;Patient limited by fatigue (Patient reports she "gets tired") Patient left: in bed;with call bell/phone within reach;with bed alarm set;with family/visitor present;with SCD's reapplied Nurse Communication: Mobility status PT Visit Diagnosis: Unsteadiness on feet (R26.81);Muscle weakness (generalized) (M62.81)    Time: 1610-9604 PT Time Calculation (min) (ACUTE ONLY): 42 min   Charges:   PT Evaluation $PT Eval Moderate Complexity: 1 Procedure PT Treatments $Gait Training: 8-22 mins $Therapeutic Activity: 8-22 mins   PT G Codes:        Durenda Hurt. Renaldo Fiddler, [facility] Acute Rehab Services Pager [phone]   Vena Austria 10/21/2016, 8:58 PM

## 2016-10-21 NOTE — Progress Notes (Signed)
ANTICOAGULATION CONSULT NOTE - follow up Pharmacy Consult for Heparin and Coumadin  Indication: atrial fibrillation, recurrent stroke with low INR   Allergies  Allergen Reactions  . Eggs Or Egg-Derived Products     Reaction unknown to patient/family    Patient Measurements: Height: 5\' 2"  (157.5 cm) Weight: 179 lb 14.3 oz (81.6 kg) IBW/kg (Calculated) : 50.1 Heparin Dosing Weight: 68.3 kg  Vital Signs: Temp: 98.8 F (37.1 C) (07/14 2115) Temp Source: Oral (07/14 2115) BP: 124/49 (07/14 2115) Pulse Rate: 81 (07/14 2115)  Labs:  Recent Labs  10/19/16 1211 10/20/16 0441 10/20/16 0452 10/20/16 0731 10/20/16 1615 10/20/16 1920 10/21/16 2042  HGB  --  11.6* 11.9*  --   --   --   --   HCT  --  35.1* 35.0*  --   --   --   --   PLT  --  118*  --   --   --   --   --   APTT  --  34  --   --   --   --   --   LABPROT 20.0* 17.8*  --   --   --   --   --   INR 1.68 1.46  --   --   --   --   --   HEPARINUNFRC  --   --   --   --   --   --  0.47  CREATININE  --  0.81 0.70  --   --   --   --   TROPONINI  --   --   --  <0.03 <0.03 <0.03  --     Estimated Creatinine Clearance: 58.3 mL/min (by C-G formula based on SCr of 0.7 mg/dL).   Medical History: Past Medical History:  Diagnosis Date  . A-fib (HCC)   . Diabetes mellitus without complication (HCC)   . Hypertension   . Stroke (HCC)   . TIA (transient ischemic attack) 10/18/2016   no residual S/P tPA/notes 10/20/2016    Medications:  See pta med list  Assessment: 76 y.o female with a history of  atrial fibrillation, diabetes, and a stroke diagnosed on 7/11 and  s/p TPA 7/11 at 0016. She was on coumadin prior to that admission and on admit 7/11 INR was subtherapeutic.   1st heparin level tonight  = 0.47 on heparin drip 950 units/hr for Afib and recurrent stroke with low INR.   Therapeutic HL.   PTA Coumadin dose: 5 mg daily-none taken  Previously the patient was on a regimen of warfarin 2.5 mg QD with a last dose on  10/17/16 prior to her recent hospitalization on 10/18/16   Goal of Therapy:  INR 2-3  Heparin level  0.3-0.5  Monitor platelets by anticoagulation protocol: Yes   Plan:  Continue heparin 950 units/hr Check HL ~6-8hr with 5am labs Daily heparin level/CBC and INR Monitor s/sx bleeding  Noah Delaineuth Avantika Shere, RPh Clinical Pharmacist Pager: [phone] Main Pharmacy [phone] 10/21/2016,10:00 PM

## 2016-10-21 NOTE — Progress Notes (Signed)
STROKE TEAM PROGRESS NOTE   HISTORY OF PRESENT ILLNESS (per record) Alexa Fox is an 76 y.o. female with h/o AFib (Coumadin-subtherap INR), DM, HTN, and recent TIA (discharged 10/19/16) seen as code stroke.  She was recently admitted/discharged (7/10- 10/19/16) for stroke (aphasia/ Rt wkness-? Embolic-s/p tPA) with complete recovery.  During her stay, she underwent testing (see below) and was discharged with a NF neurological exam on ASA/Warfarin (INR 1.68 at discharge).  According to dtrs she had complained of a headache once home, but went to bed around midnight last PM.  She then got up around 0330 hrs this AM to go to the bathroom, but ran into a doorframe.  The noise awoke the dtrs who then found her unable to speak with right face/arm weakness.  She was taken by EMS to [facility] and noted to be in AFib (HR 120- 180's enroute).  In ER BP 124/74, with noted expressive aphasia and Rt UE weakness (NIHSS 9).  She underwent a Head CT (subtle loss of grey-white matter at Lt frontal operculum and Lt posterior frontal region) and CTA H/N (negative).  INR 1.46 noted.  Pt re-examined later and seemed to be speaking better.  She will be admitted for further evalaution   SUBJECTIVE (INTERVAL HISTORY) Her daughter and granddaughter are at the bedside.  Patient condition much improved from yesterday, only with mild slurred speech and right hand weakness. INR still low, MRI showed left MCA infarct, patchy and small in size. Will start Coumadin with heparin IV bridging.   OBJECTIVE Temp:  [98.6 F (37 C)-99.6 F (37.6 C)] 98.6 F (37 C) (07/14 0742) Pulse Rate:  [70-97] 77 (07/14 0900) Cardiac Rhythm: Atrial fibrillation (07/14 0742) Resp:  [17-31] 21 (07/14 0900) BP: (89-126)/(46-97) 119/53 (07/14 0900) SpO2:  [86 %-100 %] 98 % (07/14 0900) Weight:  [81.6 kg (179 lb 14.3 oz)] 81.6 kg (179 lb 14.3 oz) (07/13 1615)  CBC:   Recent Labs Lab 10/17/16 2245 10/18/16 0428 10/20/16 0441 10/20/16 0452  WBC 9.0  9.7 10.4  --   NEUTROABS 5.8  --  8.5*  --   HGB 13.3 12.5 11.6* 11.9*  HCT 39.3 38.6 35.1* 35.0*  MCV 92.9 93.5 94.1  --   PLT 185 140* 118*  --     Basic Metabolic Panel:   Recent Labs Lab 10/18/16 0428 10/20/16 0441 10/20/16 0452  NA 140 136 142  K 4.1 4.3 4.4  CL 109 111 107  CO2 20* 22  --   GLUCOSE 126* 139* 140*  BUN 24* 9 10  CREATININE 0.92 0.81 0.70  CALCIUM 8.6* 8.7*  --     Lipid Panel:     Component Value Date/Time   CHOL 140 10/20/2016 1615   TRIG 80 10/20/2016 1615   HDL 52 10/20/2016 1615   CHOLHDL 2.7 10/20/2016 1615   VLDL 16 10/20/2016 1615   LDLCALC 72 10/20/2016 1615   HgbA1c:  Lab Results  Component Value Date   HGBA1C 5.9 (H) 10/18/2016   Urine Drug Screen:     Component Value Date/Time   LABOPIA NONE DETECTED 10/20/2016 0715   COCAINSCRNUR NONE DETECTED 10/20/2016 0715   LABBENZ NONE DETECTED 10/20/2016 0715   AMPHETMU NONE DETECTED 10/20/2016 0715   THCU NONE DETECTED 10/20/2016 0715   LABBARB NONE DETECTED 10/20/2016 0715    Alcohol Level     Component Value Date/Time   ETH <5 10/20/2016 0441    IMAGING I have personally reviewed the radiological images below and agree  with the radiology interpretations.  Ct Angio Head W Or Wo Contrast Ct Angio Neck W And/or Wo Contrast 10/20/2016 1. Negative CTA for emergent large vessel occlusion.  2. Stable exam with mild atherosclerotic changes for patient age. No high-grade or flow-limiting stenosis.  3. Small layering bilateral pleural effusions with associated atelectasis.   Mr Brain Wo Contrast 10/20/2016 1. Moderate acute infarct affecting the left post more than precentral gyri.  2. 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   Dg Chest Port 1 View 10/21/2016 Improved lung aeration since the prior exam. No evidence of pneumonia or pulmonary edema.  10/20/2016 Mild cardiomegaly with vascular congestion.  No focal  consolidation.    Ct Head Code Stroke W/o Cm  10/20/2016   1. Subtle loss of gray-white matter differentiation at the left frontal operculum and posterior left frontal region as above, suspicious for acute ischemic left MCA territory infarct. No intracranial hemorrhage.  2. ASPECTS is 8.  3. Stable atrophy with scattered remote infarcts as above.   Transthoracic echocardiogram 10/19/2016 Study Conclusions - Left ventricle: The cavity size was normal. Wall thickness was   normal. Systolic function was normal. The estimated ejection   fraction was in the range of 55% to 60%. - Aortic valve: AV is thickened, calcified with mildly restricted   motion Peak and mean gradients through the valve are 12 and 6 mm   Hg respectively consistent with mild AS. There was mild   regurgitation. Valve area (VTI): 1.98 cm^2. Valve area (Vmax):   1.9 cm^2. Valve area (Vmean): 1.97 cm^2. - Mitral valve: MV is thickened with some restricted motion. Peak   and mean gradients through the valve are 12 and 6 mm Hg   respectively MVA by Pt1/2 is 1.44 cm2 consistent with moderate   mitral stenosis. - Left atrium: The atrium was severely dilated. - Right atrium: The atrium was mildly dilated. - Tricuspid valve: There was moderate regurgitation. - Pulmonary arteries: PA peak pressure: 49 mm Hg (S).   PHYSICAL EXAM  Temp:  [98.6 F (37 C)-99.6 F (37.6 C)] 99.5 F (37.5 C) (07/14 1700) Pulse Rate:  [77-99] 93 (07/14 1700) Resp:  [17-27] 20 (07/14 1700) BP: (89-126)/(45-73) 118/45 (07/14 1700) SpO2:  [86 %-100 %] 98 % (07/14 1700)  General - Well nourished, well developed, in no apparent distress.  Ophthalmologic - Sharp disc margins OU.   Cardiovascular - irregularly irregular heart rate and rhythm.  Mental Status -  Level of arousal and orientation to time, place, and person were intact. Language including expression, naming, repetition, comprehension was assessed and found intact. Fund of Knowledge  was assessed and was intact.  Cranial Nerves II - XII - II - Visual field intact OU. III, IV, VI - Extraocular movements intact. V - Facial sensation intact bilaterally. VII - mild right facial droop. VIII - Hearing & vestibular intact bilaterally. X - Palate elevates symmetrically. XI - Chin turning & shoulder shrug intact bilaterally. XII - Tongue protrusion intact.  Motor Strength - The patient's strength was normal in all extremities except right wrist extension and finger rate 3/5 with right hand dexterity difficulty and pronator drift was absent.  Bulk was normal and fasciculations were absent.   Motor Tone - Muscle tone was assessed at the neck and appendages and was normal.  Reflexes - The patient's reflexes were 1+ in all extremities and she had no pathological reflexes.  Sensory - Light touch, temperature/pinprick were assessed  and were symmetrical.    Coordination - The patient had normal movements in the handswith no ataxia or dysmetria.  Tremor was absent.  Gait and Station - deferred to PT.    ASSESSMENT/PLAN Ms. Tameia Rafferty is a 76 y.o. female with history of TIAs, stroke 10/18/2016 treated with TPA, hypertension, diabetes mellitus, and atrial fibrillation on Coumadin with subtherapeutic INR on admission, presenting with headache, aphasia, and right-sided weakness. She did not receive IV t-PA due to anticoagulation and recent TPA therapy for stroke 10/18/2016.  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   Resultant  right facial droop, right hand weakness  CT head -  suspicious for acute ischemic left MCA territory infarct.  MRI head - moderate acute infarct affecting the left precentral gyri. Multiple remote infarcts.  CTA H&N - negative  Carotid Doppler - 10/18/2016 - No significant carotid artery stenosis demonstrated. Vertebrals are patent with antegrade flow.  2D Echo - 10/19/2016 - EF  55-60%. Severely dilated atrium but no cardiac source of emboli identified.  LDL - 72  HgbA1c - 5.9  VTE prophylaxis - SCDs DIET - DYS 1 Room service appropriate? Yes; Fluid consistency: Thin  aspirin 81 mg daily and warfarin daily prior to admission, now on warfarin daily per pharmacy. Due to recurrent stroke/TIA event with subtherapeutic INR, recommend heparin IV bridge.   Patient counseled to be compliant with her antithrombotic medications  Ongoing aggressive stroke risk factor management  Therapy recommendations:  pending  Disposition: Pending  Chronic A. fib on Coumadin  Recent admission for TIA due to subtherapeutic INR  Recurrent stroke again with subtherapeutic INR  Cardiology considered not NOAC candidate due to valvular A. Fib  Continue Coumadin, goal INR 2-3   Recommend heparin IV bridge. If takes too long for INR at the goal, may consider Lovenox after she denies.  Hypertension  Blood pressure runs somewhat low  Permissive hypertension (OK if < 180/105) but gradually normalize in 5-7 days  Long-term BP goal normotensive  Hyperlipidemia  Home meds:  Zocor 20 mg daily prior to admission  LDL 72, goal < 70  Now on Lipitor 40 mg daily  Continue statin at discharge  Diabetes  HgbA1c 5.9, goal < 7.0  Controlled  SSI  CBG monitoring  Other Stroke Risk Factors  Advanced age  Obesity, Body mass index is 32.9 kg/m., recommend weight loss, diet and exercise as appropriate   Hx stroke/TIA - admitted 10/17/16 for TIA, found to have some subtherapeutic INR, continue Coumadin and bridged with aspirin.  Other Active Problems    Hospital day # 1  Marvel Plan, MD PhD Stroke Neurology 10/21/2016 5:47 PM   To contact Stroke Continuity provider, please refer to [email or web address]. After hours, contact General Neurology

## 2016-10-21 NOTE — Progress Notes (Signed)
ANTICOAGULATION CONSULT NOTE - Initial Consult  Pharmacy Consult for Heparin and Coumadin  Indication: atrial fibrillation, recurrent stroke with low INR   Allergies  Allergen Reactions  . Eggs Or Egg-Derived Products     Reaction unknown to patient/family    Patient Measurements: Height: 5\' 2"  (157.5 cm) Weight: 179 lb 14.3 oz (81.6 kg) IBW/kg (Calculated) : 50.1 Heparin Dosing Weight: 68.3 kg  Vital Signs: Temp: 99.4 F (37.4 C) (07/14 1200) Temp Source: Oral (07/14 1200) BP: 114/63 (07/14 1200) Pulse Rate: 85 (07/14 1200)  Labs:  Recent Labs  10/19/16 1211 10/20/16 0441 10/20/16 0452 10/20/16 0731 10/20/16 1615 10/20/16 1920  HGB  --  11.6* 11.9*  --   --   --   HCT  --  35.1* 35.0*  --   --   --   PLT  --  118*  --   --   --   --   APTT  --  34  --   --   --   --   LABPROT 20.0* 17.8*  --   --   --   --   INR 1.68 1.46  --   --   --   --   CREATININE  --  0.81 0.70  --   --   --   TROPONINI  --   --   --  <0.03 <0.03 <0.03    Estimated Creatinine Clearance: 58.3 mL/min (by C-G formula based on SCr of 0.7 mg/dL).   Medical History: Past Medical History:  Diagnosis Date  . A-fib (HCC)   . Diabetes mellitus without complication (HCC)   . Hypertension   . Stroke (HCC)   . TIA (transient ischemic attack) 10/18/2016   no residual S/P tPA/notes 10/20/2016    Medications:  See pta med list  Assessment: 76 y.o female with a history of  atrial fibrillation, diabetes, and a stroke diagnosed on 7/11 and  s/p TPA 7/11 at 0016. She was on coumadin prior to that admission and on admit 711 INR was subtherapeutic.   Pt discharged home 7/12 with instructions to resume warfarin with asa bridge.  Yesterday 7/13 AM, she presented back to ED with recurrent strokelike symptoms.  In ED she developed RVR controlled with Cardizem IV.  Pharmacy consulted yesterday to start IV heparin, no bolus but later MD discontinued heparin.  Today, pharmacy consulted to start heparin and  coumadin for Afib and recurrent stroke with low INR.    INR 1.46 on 10/20/16,  Appears coumadin last taken on 10/17/16 (2.5mg ) prior to the previous admissin. H/H 11.9/35.0 and pltc 118k.   SCDs on   PTA Coumadin dose: 5 mg daily-none taken  Previously the patient was on a regimen of warfarin 2.5 mg QD with a last dose on 10/17/16 prior to her recent hospitalization on 10/18/16  Risk factors include age, HTN, Afib, recent long distance trip from [place] where she lives, under-therapeutic INR, on d/c at 1.46, mild LDL and obesity.    Goal of Therapy:  INR 2-3  Heparin level  0.3-0.5  Monitor platelets by anticoagulation protocol: Yes   Plan:  No bolus Start heparin at 950 units/h 6h heparin level Coumadin 5 mg po x1 now Daily heparin level/CBC and INR Monitor s/sx bleeding  Noah Delaineuth Racquelle Hyser, RPh Clinical Pharmacist Pager: [phone] [facility] [phone] 10/21/2016,12:14 PM

## 2016-10-22 ENCOUNTER — Inpatient Hospital Stay (HOSPITAL_COMMUNITY): Payer: Medicare Other

## 2016-10-22 LAB — CBC
HCT: 31.8 % — ABNORMAL LOW (ref 36.0–46.0)
Hemoglobin: 10.6 g/dL — ABNORMAL LOW (ref 12.0–15.0)
MCH: 30.8 pg (ref 26.0–34.0)
MCHC: 33.3 g/dL (ref 30.0–36.0)
MCV: 92.4 fL (ref 78.0–100.0)
PLATELETS: 111 10*3/uL — AB (ref 150–400)
RBC: 3.44 MIL/uL — ABNORMAL LOW (ref 3.87–5.11)
RDW: 12.6 % (ref 11.5–15.5)
WBC: 8 10*3/uL (ref 4.0–10.5)

## 2016-10-22 LAB — GLUCOSE, CAPILLARY
GLUCOSE-CAPILLARY: 130 mg/dL — AB (ref 65–99)
GLUCOSE-CAPILLARY: 109 mg/dL — AB (ref 65–99)
Glucose-Capillary: 105 mg/dL — ABNORMAL HIGH (ref 65–99)
Glucose-Capillary: 168 mg/dL — ABNORMAL HIGH (ref 65–99)
Glucose-Capillary: 69 mg/dL (ref 65–99)

## 2016-10-22 LAB — HEPARIN LEVEL (UNFRACTIONATED)
Heparin Unfractionated: 1.28 IU/mL — ABNORMAL HIGH (ref 0.30–0.70)
Heparin Unfractionated: 0.97 IU/mL — ABNORMAL HIGH (ref 0.30–0.70)
Heparin Unfractionated: 0.26 IU/mL — ABNORMAL LOW (ref 0.30–0.70)

## 2016-10-22 LAB — BASIC METABOLIC PANEL
Anion gap: 7 (ref 5–15)
BUN: 7 mg/dL (ref 6–20)
CO2: 23 mmol/L (ref 22–32)
Calcium: 8.4 mg/dL — ABNORMAL LOW (ref 8.9–10.3)
Chloride: 109 mmol/L (ref 101–111)
Creatinine, Ser: 0.65 mg/dL (ref 0.44–1.00)
GFR calc Af Amer: >60 mL/min (ref >60)
Glucose, Bld: 111 mg/dL — ABNORMAL HIGH (ref 65–99)
Potassium: 3.8 mmol/L (ref 3.5–5.1)
SODIUM: 139 mmol/L (ref 135–145)

## 2016-10-22 LAB — PROTIME-INR
INR: 1.8
PROTHROMBIN TIME: 21.1 s — AB (ref 11.4–15.2)

## 2016-10-22 MED ORDER — WARFARIN SODIUM 5 MG PO TABS
2.5000 mg | ORAL_TABLET | Freq: Once | ORAL | Status: AC
  Administered 2016-10-22: 2.5 mg via ORAL
  Filled 2016-10-22: qty 1

## 2016-10-22 MED ORDER — HEPARIN (PORCINE) IN NACL 100-0.45 UNIT/ML-% IJ SOLN
850.0000 [IU]/h | INTRAMUSCULAR | Status: DC
  Administered 2016-10-22: 700 [IU]/h via INTRAVENOUS
  Filled 2016-10-22: qty 250

## 2016-10-22 MED ORDER — METOPROLOL TARTRATE 5 MG/5ML IV SOLN: 2.5000 mg | Freq: Four times a day (QID) | INTRAVENOUS | Status: DC | PRN

## 2016-10-22 MED ORDER — METOPROLOL TARTRATE 50 MG PO TABS
50.0000 mg | ORAL_TABLET | Freq: Two times a day (BID) | ORAL | Status: DC
  Administered 2016-10-22 – 2016-10-23 (×3): 50 mg via ORAL
  Filled 2016-10-22 (×3): qty 1

## 2016-10-22 MED ORDER — DOCUSATE SODIUM 100 MG PO CAPS
100.0000 mg | ORAL_CAPSULE | Freq: Two times a day (BID) | ORAL | Status: DC
  Administered 2016-10-22 – 2016-10-23 (×2): 100 mg via ORAL
  Filled 2016-10-22 (×2): qty 1

## 2016-10-22 MED ORDER — POLYETHYLENE GLYCOL 3350 17 G PO PACK
17.0000 g | PACK | Freq: Every day | ORAL | Status: DC
  Administered 2016-10-22 – 2016-10-23 (×2): 17 g via ORAL
  Filled 2016-10-22 (×2): qty 1

## 2016-10-22 MED ORDER — FUROSEMIDE 10 MG/ML IJ SOLN
20.0000 mg | Freq: Once | INTRAMUSCULAR | Status: AC
  Administered 2016-10-22: 20 mg via INTRAVENOUS
  Filled 2016-10-22: qty 4

## 2016-10-22 NOTE — Progress Notes (Signed)
STROKE TEAM PROGRESS NOTE   HISTORY OF PRESENT ILLNESS (per record) Alexa Fox is an 76 y.o. female with h/o AFib (Coumadin-subtherap INR), DM, HTN, and recent TIA (discharged 10/19/16) seen as code stroke.  She was recently admitted/discharged (7/10- 10/19/16) for stroke (aphasia/ Rt wkness-? Embolic-s/p tPA) with complete recovery.  During her stay, she underwent testing (see below) and was discharged with a NF neurological exam on ASA/Warfarin (INR 1.68 at discharge).  According to dtrs she had complained of a headache once home, but went to bed around midnight last PM.  She then got up around 0330 hrs this AM to go to the bathroom, but ran into a doorframe.  The noise awoke the dtrs who then found her unable to speak with right face/arm weakness.  She was taken by EMS to [facility] and noted to be in AFib (HR 120- 180's enroute).  In ER BP 124/74, with noted expressive aphasia and Rt UE weakness (NIHSS 9).  She underwent a Head CT (subtle loss of grey-white matter at Lt frontal operculum and Lt posterior frontal region) and CTA H/N (negative).  INR 1.46 noted.  Pt re-examined later and seemed to be speaking better.  She will be admitted for further evalaution   SUBJECTIVE (INTERVAL HISTORY) Her daughter is at the bedside.  Patient condition similar as yesterday, still has mild expressive aphasia and right hand weakness. INR 1.8. Still on heparin IV. Patient complains of frontal bilateral mild headache. Will repeat CT head to rule out hemorrhagic transformation   OBJECTIVE Temp:  [98.7 F (37.1 C)-99.5 F (37.5 C)] 98.7 F (37.1 C) (07/15 0500) Pulse Rate:  [77-113] 84 (07/15 0500) Cardiac Rhythm: Atrial fibrillation (07/15 0700) Resp:  [18-27] 18 (07/15 0500) BP: (100-124)/(45-65) 124/65 (07/15 0500) SpO2:  [93 %-100 %] 94 % (07/15 0500)  CBC:   Recent Labs Lab 10/17/16 2245  10/20/16 0441 10/20/16 0452 10/22/16 0503  WBC 9.0  < > 10.4  --  8.0  NEUTROABS 5.8  --  8.5*  --   --   HGB  13.3  < > 11.6* 11.9* 10.6*  HCT 39.3  < > 35.1* 35.0* 31.8*  MCV 92.9  < > 94.1  --  92.4  PLT 185  < > 118*  --  111*  < > = values in this interval not displayed.  Basic Metabolic Panel:   Recent Labs Lab 10/20/16 0441 10/20/16 0452 10/22/16 0503  NA 136 142 139  K 4.3 4.4 3.8  CL 111 107 109  CO2 22  --  23  GLUCOSE 139* 140* 111*  BUN 9 10 7   CREATININE 0.81 0.70 0.65  CALCIUM 8.7*  --  8.4*    Lipid Panel:     Component Value Date/Time   CHOL 140 10/20/2016 1615   TRIG 80 10/20/2016 1615   HDL 52 10/20/2016 1615   CHOLHDL 2.7 10/20/2016 1615   VLDL 16 10/20/2016 1615   LDLCALC 72 10/20/2016 1615   HgbA1c:  Lab Results  Component Value Date   HGBA1C 5.9 (H) 10/18/2016   Urine Drug Screen:     Component Value Date/Time   LABOPIA NONE DETECTED 10/20/2016 0715   COCAINSCRNUR NONE DETECTED 10/20/2016 0715   LABBENZ NONE DETECTED 10/20/2016 0715   AMPHETMU NONE DETECTED 10/20/2016 0715   THCU NONE DETECTED 10/20/2016 0715   LABBARB NONE DETECTED 10/20/2016 0715    Alcohol Level     Component Value Date/Time   ETH <5 10/20/2016 0441    IMAGING  I have personally reviewed the radiological images below and agree with the radiology interpretations.  Ct Angio Head W Or Wo Contrast Ct Angio Neck W And/or Wo Contrast 10/20/2016 1. Negative CTA for emergent large vessel occlusion.  2. Stable exam with mild atherosclerotic changes for patient age. No high-grade or flow-limiting stenosis.  3. Small layering bilateral pleural effusions with associated atelectasis.   Mr Brain Wo Contrast 10/20/2016 1. Moderate acute infarct affecting the left post more than precentral gyri.  2. 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   Dg Chest Port 1 View 10/21/2016 Improved lung aeration since the prior exam. No evidence of pneumonia or pulmonary edema.  10/20/2016 Mild cardiomegaly with vascular congestion.  No focal  consolidation.    Ct Head Code Stroke W/o Cm  10/20/2016   1. Subtle loss of gray-white matter differentiation at the left frontal operculum and posterior left frontal region as above, suspicious for acute ischemic left MCA territory infarct. No intracranial hemorrhage.  2. ASPECTS is 8.  3. Stable atrophy with scattered remote infarcts as above.   Ct Head Code W/o Cm 10/22/2016 Increasingly well-defined LEFT hemisphere LEFT MCA territory cerebral infarction. No hemorrhagic transformation.  Transthoracic echocardiogram 10/19/2016 Study Conclusions - Left ventricle: The cavity size was normal. Wall thickness was   normal. Systolic function was normal. The estimated ejection   fraction was in the range of 55% to 60%. - Aortic valve: AV is thickened, calcified with mildly restricted   motion Peak and mean gradients through the valve are 12 and 6 mm   Hg respectively consistent with mild AS. There was mild   regurgitation. Valve area (VTI): 1.98 cm^2. Valve area (Vmax):   1.9 cm^2. Valve area (Vmean): 1.97 cm^2. - Mitral valve: MV is thickened with some restricted motion. Peak   and mean gradients through the valve are 12 and 6 mm Hg   respectively MVA by Pt1/2 is 1.44 cm2 consistent with moderate   mitral stenosis. - Left atrium: The atrium was severely dilated. - Right atrium: The atrium was mildly dilated. - Tricuspid valve: There was moderate regurgitation. - Pulmonary arteries: PA peak pressure: 49 mm Hg (S).   PHYSICAL EXAM  Temp:  [98.7 F (37.1 C)-99.5 F (37.5 C)] 98.7 F (37.1 C) (07/15 0500) Pulse Rate:  [77-113] 84 (07/15 0500) Resp:  [18-27] 18 (07/15 0500) BP: (100-124)/(45-65) 124/65 (07/15 0500) SpO2:  [93 %-100 %] 94 % (07/15 0500)  General - Well nourished, well developed, in no apparent distress.  Ophthalmologic - Sharp disc margins OU.   Cardiovascular - irregularly irregular heart rate and rhythm.  Mental Status -  Level of arousal and orientation to  time, place, and person were intact. Language including expression, naming, repetition, comprehension was assessed and found intact. Fund of Knowledge was assessed and was intact.  Cranial Nerves II - XII - II - Visual field intact OU. III, IV, VI - Extraocular movements intact. V - Facial sensation intact bilaterally. VII - mild right facial droop. VIII - Hearing & vestibular intact bilaterally. X - Palate elevates symmetrically. XI - Chin turning & shoulder shrug intact bilaterally. XII - Tongue protrusion intact.  Motor Strength - The patient's strength was normal in all extremities except right wrist extension and finger rate 3/5 with right hand dexterity difficulty and pronator drift was absent.  Bulk was normal and fasciculations were absent.   Motor Tone - Muscle tone was assessed at the neck and  appendages and was normal.  Reflexes - The patient's reflexes were 1+ in all extremities and she had no pathological reflexes.  Sensory - Light touch, temperature/pinprick were assessed and were symmetrical.    Coordination - The patient had normal movements in the handswith no ataxia or dysmetria.  Tremor was absent.  Gait and Station - deferred to PT.    ASSESSMENT/PLAN Ms. Elanor Cale is a 76 y.o. female with history of TIAs, stroke 10/18/2016 treated with TPA, hypertension, diabetes mellitus, and atrial fibrillation on Coumadin with subtherapeutic INR on admission, presenting with headache, aphasia, and right-sided weakness. She did not receive IV t-PA due to anticoagulation and recent TPA therapy for stroke 10/18/2016.  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   Resultant  right facial droop, right hand weakness  CT head -  suspicious for acute ischemic left MCA territory infarct.  CT Head repeat - no hemorrhagic transformation  MRI head - moderate acute infarct affecting the left precentral gyri.  Multiple remote infarcts.  CTA H&N - negative  Carotid Doppler - 10/18/2016 - No significant carotid artery stenosis demonstrated. Vertebrals are patent with antegrade flow.  2D Echo - 10/19/2016 - EF 55-60%. Severely dilated atrium but no cardiac source of emboli identified.  LDL - 72  HgbA1c - 5.9  VTE prophylaxis - SCDs DIET - DYS 1 Room service appropriate? Yes; Fluid consistency: Thin  aspirin 81 mg daily and warfarin daily prior to admission, now on warfarin daily per pharmacy. Due to recurrent stroke event with subtherapeutic INR, recommend heparin IV bridge. Once INR at goal of 2-3, heparin IV can be stopped.  Patient counseled to be compliant with her antithrombotic medications  Ongoing aggressive stroke risk factor management  Therapy recommendations:  Home health OT recommended. PT evaluation pending  Disposition: Pending  Chronic A. fib on Coumadin  Recent admission for TIA due to subtherapeutic INR  Recurrent stroke again with subtherapeutic INR  Cardiology considered not NOAC candidate due to valvular A. Fib  Continue Coumadin, goal INR 2-3  (INR - 1.80 today)  Recommend heparin IV bridge. Once INR at goal, heparin can be stopped  Hypertension  Blood pressure runs somewhat low  Permissive hypertension (OK if < 180/105) but gradually normalize in 5-7 days  Long-term BP goal normotensive  Hyperlipidemia  Home meds:  Zocor 20 mg daily prior to admission  LDL 72, goal < 70  Now on Lipitor 40 mg daily  Continue statin at discharge  Diabetes  HgbA1c 5.9, goal < 7.0  Controlled  SSI  CBG monitoring  Other Stroke Risk Factors  Advanced age  Obesity, Body mass index is 32.9 kg/m., recommend weight loss, diet and exercise as appropriate   Hx stroke/TIA - admitted 10/17/16 for TIA, found to have some subtherapeutic INR, continue Coumadin and bridged with aspirin.  Other Active Problems    Hospital day # 2  Marvel Plan, MD PhD Stroke  Neurology 10/22/2016 3:07 PM   To contact Stroke Continuity provider, please refer to [email or web address]. After hours, contact General Neurology

## 2016-10-22 NOTE — Progress Notes (Signed)
ANTICOAGULATION CONSULT NOTE - Follow Up Consult  Pharmacy Consult for heparin Indication: Afib and recurrent stroke  Labs:  Recent Labs  10/19/16 1211  10/20/16 0441 10/20/16 0452 10/20/16 0731 10/20/16 1615 10/20/16 1920 10/21/16 2042 10/22/16 0503  HGB  --   < > 11.6* 11.9*  --   --   --   --  10.6*  HCT  --   --  35.1* 35.0*  --   --   --   --  31.8*  PLT  --   --  118*  --   --   --   --   --  111*  APTT  --   --  34  --   --   --   --   --   --   LABPROT 20.0*  --  17.8*  --   --   --   --   --  21.1*  INR 1.68  --  1.46  --   --   --   --   --  1.80  HEPARINUNFRC  --   --   --   --   --   --   --  0.47 1.28*  CREATININE  --   --  0.81 0.70  --   --   --   --  0.65  TROPONINI  --   --   --   --  <0.03 <0.03 <0.03  --   --   < > = values in this interval not displayed.   Assessment: 76yo female now above goal on heparin after one level at goal; drawn from opposite arm from where heparin is running.  Goal of Therapy:  Heparin level 0.3-0.5 units/ml   Plan:  Have asked RN to turn off heparin; will get additional stat heparin level to ensure level is accurate.  Vernard GamblesVeronda Daylen Lipsky, PharmD, BCPS  10/22/2016,6:47 AM

## 2016-10-22 NOTE — Progress Notes (Signed)
ANTICOAGULATION CONSULT NOTE - Follow Up Consult  Pharmacy Consult for heparin Indication: Afib and recurrent stroke  Labs:  Recent Labs  10/19/16 1211  10/20/16 0441 10/20/16 0452 10/20/16 0731 10/20/16 1615 10/20/16 1920 10/21/16 2042 10/22/16 0503 10/22/16 0702  HGB  --   < > 11.6* 11.9*  --   --   --   --  10.6*  --   HCT  --   --  35.1* 35.0*  --   --   --   --  31.8*  --   PLT  --   --  118*  --   --   --   --   --  111*  --   APTT  --   --  34  --   --   --   --   --   --   --   LABPROT 20.0*  --  17.8*  --   --   --   --   --  21.1*  --   INR 1.68  --  1.46  --   --   --   --   --  1.80  --   HEPARINUNFRC  --   --   --   --   --   --   --  0.47 1.28* 0.97*  CREATININE  --   --  0.81 0.70  --   --   --   --  0.65  --   TROPONINI  --   --   --   --  <0.03 <0.03 <0.03  --   --   --   < > = values in this interval not displayed.   Assessment: 76yo female now above goal on heparin. Lab was drawn this am and heparin level was 1.28. Heparin was held at 0650 until stat heparin level could be drawn. Level was reported back as 0.97, which is supratherapeutic. INR today is 1.80 with 5mg  warfarin dose yesterday.   Goal of Therapy:  Heparin level 0.3-0.5 units/ml  INR goal 2-3   Plan:  Lower heparin rate to 700 units/h Recheck 8h heparin level at 1700  Warfarin 2.5 mg po x1, likely require an alternating 5mg  and 2.5mg  regimen Daily heparin level/CBC and INR Monitor s/sx bleeding  Blake DivineShannon Brandilee Pies, Pharm.D. PGY1 Pharmacy Resident 10/22/2016 9:15 AM Main Pharmacy: [phone] Pager [phone]

## 2016-10-22 NOTE — Progress Notes (Signed)
ANTICOAGULATION CONSULT NOTE - follow up Pharmacy Consult for Heparin and Coumadin  Indication: atrial fibrillation, recurrent stroke with low INR   Allergies  Allergen Reactions  . Eggs Or Egg-Derived Products     Reaction unknown to patient/family    Patient Measurements: Height: 5\' 2"  (157.5 cm) Weight: 179 lb 14.3 oz (81.6 kg) IBW/kg (Calculated) : 50.1 Heparin Dosing Weight: 68.3 kg  Vital Signs: Temp: (P) 98.2 F (36.8 C) (07/15 1750) Temp Source: (P) Oral (07/15 1750) BP: (P) 116/63 (07/15 1750) Pulse Rate: (P) 95 (07/15 1750)  Labs:  Recent Labs  10/20/16 0441 10/20/16 0452 10/20/16 0731 10/20/16 1615 10/20/16 1920  10/22/16 0503 10/22/16 0702 10/22/16 1606  HGB 11.6* 11.9*  --   --   --   --  10.6*  --   --   HCT 35.1* 35.0*  --   --   --   --  31.8*  --   --   PLT 118*  --   --   --   --   --  111*  --   --   APTT 34  --   --   --   --   --   --   --   --   LABPROT 17.8*  --   --   --   --   --  21.1*  --   --   INR 1.46  --   --   --   --   --  1.80  --   --   HEPARINUNFRC  --   --   --   --   --   < > 1.28* 0.97* 0.26*  CREATININE 0.81 0.70  --   --   --   --  0.65  --   --   TROPONINI  --   --  <0.03 <0.03 <0.03  --   --   --   --   < > = values in this interval not displayed.  Estimated Creatinine Clearance: 58.3 mL/min (by C-G formula based on SCr of 0.65 mg/dL).   Medical History: Past Medical History:  Diagnosis Date  . A-fib (HCC)   . Diabetes mellitus without complication (HCC)   . Hypertension   . Stroke (HCC)   . TIA (transient ischemic attack) 10/18/2016   no residual S/P tPA/notes 10/20/2016    Medications:  See pta med list  Assessment: 76 y.o female with a history of  atrial fibrillation, diabetes, and a stroke diagnosed on 7/11 and  s/p TPA 7/11 at 0016. She was on coumadin prior to that admission and on admit 7/11 INR was subtherapeutic.   Heparin level supratherapeutic earlier today. Now subtherapeutic 0.26 after rate  adjustment. Will increase dose slightly and continue to monitor closely. RN reports bleeding around IV site earlier this evening. Bleeding controlled with pressure.  PTA Coumadin dose: 5 mg daily  Goal of Therapy:  INR 2-3  Heparin level  0.3-0.5  Monitor platelets by anticoagulation protocol: Yes   Plan:  Increase heparin gtt to 750 units/hr  Recheck heparin level at 0200 Warfarin 2.5 mg po x1, likely require an alternating 5mg  and 2.5mg  regimen Daily heparin level/CBC and INR Monitor s/sx bleeding  Ruben Imony Sequoia Witz, PharmD Clinical Pharmacist 10/22/2016 6:28 PM

## 2016-10-22 NOTE — Progress Notes (Addendum)
[facility] PROGRESS NOTE  [name] Fox [id number] DOB: 12/20/1940 DOA: 10/20/2016  PCP: System, Pcp Not In  Brief History/Interval Summary: 76 year old Caucasian female with a past medical history of atrial fibrillation, diabetes, essential hypertension, history of TIA recently hospitalized for acute stroke requiring TPA. On 7/11. She was discharged home by neurology without any residual symptoms. She presented back to the hospital on the day of admission after waking up with slurred speech, right facial droop and right arm weakness. She was hospitalized for further management.  Reason for Visit: Acute stroke  Consultants: Neurology  Procedures: None  Antibiotics: None  Subjective/Interval History: Patient feels better. Does have a headache this morning. Denies any vision changes. Speech has improved. Strength in the right upper extremity is also improved.   ROS: No chest pain or shortness of breath  Objective:  Vital Signs  Vitals:   10/21/16 2115 10/22/16 0100 10/22/16 0500 10/22/16 1001  BP: (!) 124/49 (!) 117/53 124/65 116/70  Pulse: 81 (!) 113 84 (!) 114  Resp: 18 18 18 18   Temp: 98.8 F (37.1 C) 98.8 F (37.1 C) 98.7 F (37.1 C) 98.6 F (37 C)  TempSrc: Oral Oral Oral Oral  SpO2: 95% 95% 94% 94%  Weight:      Height:        Intake/Output Summary (Last 24 hours) at 10/22/16 1012 Last data filed at 10/22/16 0600  Gross per 24 hour  Intake            596.5 ml  Output             1475 ml  Net           -878.5 ml   Filed Weights   10/20/16 0457 10/20/16 1615  Weight: 84.9 kg (187 lb 2.7 oz) 81.6 kg (179 lb 14.3 oz)   Telemetry shows atrial fibrillation with RVR with heart rate ranging between 110 to 130  General appearance: Awake, alert. In no distress Resp: Clear to auscultation bilaterally Cardio: S1, S2 is normal, regular. No S3, S4. No rubs, murmurs or bruit GI: Abdomen is soft. Nontender, nondistended. Bowel sounds are present. No  masses, organomegaly Extremities: No pedal edema Neurologic: Awake and alert. Oriented 3. Subtle right facial droop. Some dysarthria is appreciated. Mild right pronator drift.   Lab Results:  Data Reviewed: I have personally reviewed following labs and imaging studies  CBC:  Recent Labs Lab 10/17/16 2245 10/18/16 0428 10/20/16 0441 10/20/16 0452 10/22/16 0503  WBC 9.0 9.7 10.4  --  8.0  NEUTROABS 5.8  --  8.5*  --   --   HGB 13.3 12.5 11.6* 11.9* 10.6*  HCT 39.3 38.6 35.1* 35.0* 31.8*  MCV 92.9 93.5 94.1  --  92.4  PLT 185 140* 118*  --  111*    Basic Metabolic Panel:  Recent Labs Lab 10/17/16 2245 10/18/16 0428 10/20/16 0441 10/20/16 0452 10/22/16 0503  NA 138 140 136 142 139  K 3.8 4.1 4.3 4.4 3.8  CL 102 109 111 107 109  CO2 28 20* 22  --  23  GLUCOSE 171* 126* 139* 140* 111*  BUN 29* 24* 9 10 7   CREATININE 1.04* 0.92 0.81 0.70 0.65  CALCIUM 9.3 8.6* 8.7*  --  8.4*    GFR: Estimated Creatinine Clearance: 58.3 mL/min (by C-G formula based on SCr of 0.65 mg/dL).  Liver Function Tests:  Recent Labs Lab 10/17/16 2245 10/20/16 0441  AST 22 17  ALT 23 16  ALKPHOS 55  43  BILITOT 0.5 1.2  PROT 7.1 5.7*  ALBUMIN 4.0 3.6     Coagulation Profile:  Recent Labs Lab 10/17/16 2245 10/19/16 1211 10/20/16 0441 10/22/16 0503  INR 1.55 1.68 1.46 1.80    Cardiac Enzymes:  Recent Labs Lab 10/19/16 0635 10/19/16 1101 10/20/16 0731 10/20/16 1615 10/20/16 1920  TROPONINI <0.03 <0.03 <0.03 <0.03 <0.03    CBG:  Recent Labs Lab 10/21/16 0803 10/21/16 1210 10/21/16 1610 10/21/16 2117 10/22/16 0559  GLUCAP 104* 126* 139* 91 105*    Lipid Profile:  Recent Labs  10/20/16 1615  CHOL 140  HDL 52  LDLCALC 72  TRIG 80  CHOLHDL 2.7    Anemia Panel:  Recent Labs  10/20/16 0915 10/20/16 0945  VITAMINB12 255  --   FOLATE  --  17.8  FERRITIN 73  --   TIBC 267  --   IRON 28  --   RETICCTPCT 1.3  --     Recent Results (from the past  240 hour(s))  MRSA PCR Screening     Status: None   Collection Time: 10/18/16  1:56 AM  Result Value Ref Range Status   MRSA by PCR NEGATIVE NEGATIVE Final    Comment:        The GeneXpert MRSA Assay (FDA approved for NASAL specimens only), is one component of a comprehensive MRSA colonization surveillance program. It is not intended to diagnose MRSA infection nor to guide or monitor treatment for MRSA infections.   MRSA PCR Screening     Status: None   Collection Time: 10/20/16  4:18 PM  Result Value Ref Range Status   MRSA by PCR NEGATIVE NEGATIVE Final    Comment:        The GeneXpert MRSA Assay (FDA approved for NASAL specimens only), is one component of a comprehensive MRSA colonization surveillance program. It is not intended to diagnose MRSA infection nor to guide or monitor treatment for MRSA infections.       Radiology Studies: Dg Chest Port 1 View  Result Date: 10/21/2016 CLINICAL DATA:  Hx of afib, DM, HTN, TIA. Never smoker. EXAM: PORTABLE CHEST 1 VIEW COMPARISON:  10/20/2016 FINDINGS: Cardiac silhouette is mildly enlarged. No mediastinal or hilar masses. Vascular congestion noted previously has improved. Lungs are now essentially clear. No convincing pleural effusion.  No pneumothorax. IMPRESSION: 1. Improved lung aeration since the prior exam. No evidence of pneumonia or pulmonary edema. Electronically Signed   By: Amie Portland M.D.   On: 10/21/2016 08:07     Medications:  Scheduled: .  stroke: mapping our early stages of recovery book   Does not apply Once  . atorvastatin  40 mg Oral q1800  . docusate  100 mg Oral Daily  . feeding supplement (ENSURE ENLIVE)  237 mL Oral BID BM  . insulin aspart  0-9 Units Subcutaneous TID WC  . metoprolol tartrate  50 mg Oral BID  . pantoprazole  40 mg Oral Q1200  . warfarin  2.5 mg Oral ONCE-1800  . Warfarin - Pharmacist Dosing Inpatient   Does not apply q1800   Continuous: . sodium chloride 50 mL/hr at 10/22/16  0000  . 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 Presented back with worsening symptoms. Appears to have had a new stroke. She has a history of atrial fibrillation and is on warfarin. INR was subtherapeutic. Neurology is following. They have initiated IV heparin along with warfarin. Await therapeutic INR. Recently completed all stroke workup and neurology does not have any further testing recommendations. Continue statin. Seen by physical and occupational therapy. Home health has been recommended. Seen also by speech therapy. She is on a dysphagia diet.  Essential hypertension. stable.  Hyperlipidemia. LDL 86. Continue statin.  Atrial fibrillation with RVR She needs to be on anticoagulation due to high chads2vasc score of 7. 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 Seen by cardiology. Recommend INR close to 2.5. Heart rate noted to be elevated. Resume her beta blocker. She is asymptomatic. She did have echocardiogram during her last hospitalization which showed normal systolic function. She does have moderate mitral stenosis. Patient has a history of rheumatic fever. Patient mentioned that she's had problems controlling her INR the last 6 weeks. She is originally from [place] and is visiting family in [place]. Check TSH.  History of type 2 diabetes mellitus. Monitor CBGs. Sliding scale insulin coverage.  Mild thrombocytopenia. No evidence of bleeding. Counts are slightly lower than what they were during her  previous hospitalization. Etiology unclear. Continue to monitor. Stable.  Normocytic anemia. Mild drop in hemoglobin, likely dilutional. Continue to monitor. No evidence for bleeding.   DVT Prophylaxis: Warfarin    Code Status: Full code  Family Communication: Discussed with the patient and her daughter  Disposition Plan: Continue management as outlined above. Should be able to discharge when INR is therapeutic. Wait for improvement in heart rate as well. Anticipate discharge in the next 1-2 days.    LOS: 2 days   [facility]  [facility] Pager [phone] 10/22/2016, 10:12 AM  If 7PM-7AM, please contact night-coverage at [email or web address], password [facility]

## 2016-10-22 NOTE — Progress Notes (Signed)
CSW acknowledged consult for assistance with nursing home placement for pt returning home to [place] at DC. Reviewed chart- therapy evaluation recommends HHPT as DC follow up, plan currently to return home at DC.  CSW signing off, please consult if additional needs arise.  Ilean SkillMeghan Kerrilynn Derenzo, MSW, LCSW Clinical Social Work 10/22/2016 Weekend coverage [phone]

## 2016-10-22 NOTE — Evaluation (Signed)
Occupational Therapy Evaluation Patient Details Name: Alexa Fox MRN: [id number] DOB: Dec 07, 1940 Today's Date: 10/22/2016    History of Present Illness Alexa Fox a 76 y.o.femalewith a past medical history of A. fib, diabetes, hypertension, stroke who presents with acute onset right-sided weakness and aphasia.  Patient with Lt MCA stroke and Afib.      PMH:  recent hospitalization 10/17/16-10/19/16 with the same symptoms - tPA given.    Afib, DM, HTN, TIA, CVA (9 yrs ago)   Clinical Impression   Pt admitted with the above diagnoses and presents with below problem list. Pt will benefit from continued acute OT to address the below listed deficits and maximize independence with basic ADLs prior to d/c to venue below. PTA pt was independent with ADLs. Pt presents with weakness in RUE most notably in extensors of wrist and hand. May benefit from splint, will assess further during next OT session. Pt also presents with some cognitive deficits, unsure how much is acute vs baseline.  Pt currently setup to mod A with UB ADLs, min guard for functional transfers/mobility.      Follow Up Recommendations  Home health OT;Supervision/Assistance - 24 hour    Equipment Recommendations  None recommended by OT    Recommendations for Other Services       Precautions / Restrictions Precautions Precautions: Fall Restrictions Weight Bearing Restrictions: No      Mobility Bed Mobility               General bed mobility comments: up in chair  Transfers Overall transfer level: Needs assistance Equipment used: None Transfers: Sit to/from Stand Sit to Stand: Min guard         General transfer comment: close min guard. from recliner and comfort height toilet    Balance Overall balance assessment: Needs assistance Sitting-balance support: Feet supported;No upper extremity supported Sitting balance-Leahy Scale: Good     Standing balance support: No upper extremity  supported Standing balance-Leahy Scale: Fair                             ADL either performed or assessed with clinical judgement   ADL Overall ADL's : Needs assistance/impaired Eating/Feeding: Set up;Sitting;Moderate assistance Eating/Feeding Details (indicate cue type and reason): setup using nondominant hand. Mod A if using dominant R hand. Grooming: Moderate assistance;Sitting   Upper Body Bathing: Moderate assistance;Sitting   Lower Body Bathing: Minimal assistance;Set up Lower Body Bathing Details (indicate cue type and reason): R wrist and hand extension deficits.  Upper Body Dressing : Set up;Minimal assistance;Sitting   Lower Body Dressing: Set up;Minimal assistance   Toilet Transfer: Min guard;Comfort height toilet;Grab bars   Toileting- Clothing Manipulation and Hygiene: Min guard;Minimal assistance;Sit to/from stand   Tub/ Shower Transfer: Min guard   Functional mobility during ADLs: Min guard General ADL Comments: Pt with deficits in extensor muscles of R wrist and hand as well as some edema impacting functinoal task performance. Pt using LUE as able to compensate. Encouraged pt in ways to incorporate RUE into tasks. Also educated on positioning and ROM exercises. Some difficulty with command following, sequencing, and attention.      Vision Baseline Vision/History: Wears glasses       Perception     Praxis      Pertinent Vitals/Pain Pain Assessment: Faces Faces Pain Scale: Hurts little more Pain Location: Rt hand Pain Descriptors / Indicators: Aching;Sore Pain Intervention(s): Monitored during session;Repositioned     Hand  Dominance Right   Extremity/Trunk Assessment Upper Extremity Assessment Upper Extremity Assessment: RUE deficits/detail RUE Deficits / Details: shoulder to forearm 4/5, 3/5 extensor muscles wrist and distal. Edema noted in R hand. Educated on positioning and ROM exercises. May benefit from splint. Sensation appears to be  intact. RUE Coordination: decreased fine motor;decreased gross motor   Lower Extremity Assessment Lower Extremity Assessment: Defer to PT evaluation   Cervical / Trunk Assessment Cervical / Trunk Assessment: Normal   Communication Communication Communication: Expressive difficulties   Cognition Arousal/Alertness: Awake/alert Behavior During Therapy: Impulsive;Flat affect Overall Cognitive Status: Impaired/Different from baseline Area of Impairment: Following commands;Safety/judgement;Problem solving;Attention                   Current Attention Level: Selective   Following Commands: Follows one step commands inconsistently;Follows one step commands with increased time Safety/Judgement: Decreased awareness of safety   Problem Solving: Slow processing;Difficulty sequencing;Requires verbal cues     General Comments       Exercises     Shoulder Instructions      Home Living Family/patient expects to be discharged to:: Private residence Living Arrangements: Alone Available Help at Discharge: Family;Available 24 hours/day (Daughter's home) Type of Home: House Home Access: Stairs to enter Entergy CorporationEntrance Stairs-Number of Steps: 4/5 Entrance Stairs-Rails: Right Home Layout: Two level Alternate Level Stairs-Number of Steps: 12 Alternate Level Stairs-Rails: Right           Home Equipment: None   Additional Comments: Per daughter, family wants pt to return to [place] ASAP and will move in with daughter there.  That home has only 1 step and is 1 level.  Lives With: Alone    Prior Functioning/Environment Level of Independence: Independent                 OT Problem List: Decreased strength;Decreased activity tolerance;Impaired balance (sitting and/or standing);Decreased coordination;Decreased cognition;Decreased safety awareness;Decreased knowledge of use of DME or AE;Decreased knowledge of precautions;Impaired tone;Impaired UE functional use;Pain;Increased edema       OT Treatment/Interventions: Self-care/ADL training;Therapeutic exercise;Energy conservation;DME and/or AE instruction;Splinting;Therapeutic activities;Cognitive remediation/compensation;Patient/family education;Balance training    OT Goals(Current goals can be found in the care plan section) Acute Rehab OT Goals Patient Stated Goal: None stated OT Goal Formulation: With patient/family Time For Goal Achievement: 11/05/16 Potential to Achieve Goals: Good ADL Goals Pt Will Perform Eating: with modified independence;sitting (using dominant hand) Pt Will Perform Grooming: with modified independence;with adaptive equipment;sitting Pt Will Perform Upper Body Bathing: with modified independence;sitting Pt Will Perform Upper Body Dressing: with modified independence;sitting Pt Will Perform Tub/Shower Transfer: with supervision;ambulating;3 in 1 Additional ADL Goal #1: Pt will be independent with positioning and ROM exercises of R wrist and hand.   OT Frequency: Min 2X/week   Barriers to D/C:            Co-evaluation              AM-PAC PT "6 Clicks" Daily Activity     Outcome Measure Help from another person eating meals?: None Help from another person taking care of personal grooming?: A Lot Help from another person toileting, which includes using toliet, bedpan, or urinal?: A Little Help from another person bathing (including washing, rinsing, drying)?: A Little Help from another person to put on and taking off regular upper body clothing?: A Little Help from another person to put on and taking off regular lower body clothing?: A Little 6 Click Score: 18   End of Session    Activity Tolerance:  Patient tolerated treatment well Patient left: in chair;with call bell/phone within reach;with family/visitor present  OT Visit Diagnosis: Unsteadiness on feet (R26.81);Pain;Muscle weakness (generalized) (M62.81)                Time: 1610-9604 OT Time Calculation (min): 27  min Charges:  OT General Charges $OT Visit: 1 Procedure OT Evaluation $OT Eval Low Complexity: 1 Procedure OT Treatments $Self Care/Home Management : 8-22 mins G-Codes:       Pilar Grammes 10/22/2016, 12:39 PM

## 2016-10-23 LAB — HEPARIN LEVEL (UNFRACTIONATED)

## 2016-10-23 LAB — ECHOCARDIOGRAM COMPLETE
AOVTI: 37.9 cm
AV Area VTI index: 1.05 cm2/m2
AV Area VTI: 1.9 cm2
AV Area mean vel: 1.97 cm2
AV area mean vel ind: 1.05 cm2/m2
AV vel: 1.98
AVCELMEANRAT: 0.78
AVG: 6 mmHg
AVLVOTPG: 6 mmHg
AVPG: 12 mmHg
AVPHT: 406 ms
AVPKVEL: 170 cm/s
Ao pk vel: 0.75 m/s
Ao-asc: 32 cm
CHL CUP AV PEAK INDEX: 1.01
CHL CUP DOP CALC LVOT VTI: 29.5 cm
CHL CUP MV DEC (S): 475
CHL CUP TV REG PEAK VELOCITY: 293 cm/s
DOP CAL AO MEAN VELOCITY: 115 cm/s
E decel time: 475 msec
EERAT: 29.49
FS: 33 % (ref 28–44)
HEIGHTINCHES: 62 in
IV/PV OW: 0.83
LA diam index: 2.65 cm/m2
LA vol index: 72 mL/m2
LA vol: 136 mL
LASIZE: 50 mm
LAVOLA4C: 140 mL
LDCA: 2.54 cm2
LEFT ATRIUM END SYS DIAM: 50 mm
LV E/e' medial: 29.49
LV E/e'average: 29.49
LV PW d: 10.5 mm — AB (ref 0.6–1.1)
LV TDI E'LATERAL: 7.29
LV TDI E'MEDIAL: 9.25
LVELAT: 7.29 cm/s
LVOT SV: 75 mL
LVOT peak VTI: 0.78 cm
LVOTD: 18 mm
LVOTPV: 127 cm/s
MRPISAEROA: 0.14 cm2
MVAP: 1.47 cm2
MVPG: 18 mmHg
MVPKEVEL: 215 m/s
P 1/2 time: 153 ms
PV Reg grad dias: 7 mmHg
PV Reg vel dias: 132 cm/s
RV LATERAL S' VELOCITY: 9.68 cm/s
RV sys press: 49 mmHg
TAPSE: 22.2 mm
TR max vel: 293 cm/s
VTI: 153 cm
Valve area index: 1.05
Valve area: 1.98 cm2
WEIGHTICAEL: 2793.67 [oz_av]

## 2016-10-23 LAB — CBC
HCT: 31.1 % — ABNORMAL LOW (ref 36.0–46.0)
HEMOGLOBIN: 10.5 g/dL — AB (ref 12.0–15.0)
MCH: 30.9 pg (ref 26.0–34.0)
MCHC: 33.8 g/dL (ref 30.0–36.0)
MCV: 91.5 fL (ref 78.0–100.0)
PLATELETS: 101 10*3/uL — AB (ref 150–400)
RBC: 3.4 MIL/uL — AB (ref 3.87–5.11)
RDW: 12.7 % (ref 11.5–15.5)
WBC: 7.8 10*3/uL (ref 4.0–10.5)

## 2016-10-23 LAB — BASIC METABOLIC PANEL
ANION GAP: 8 (ref 5–15)
BUN: 10 mg/dL (ref 6–20)
CHLORIDE: 107 mmol/L (ref 101–111)
CO2: 24 mmol/L (ref 22–32)
Calcium: 8.6 mg/dL — ABNORMAL LOW (ref 8.9–10.3)
Creatinine, Ser: 0.68 mg/dL (ref 0.44–1.00)
GFR calc Af Amer: >60 mL/min (ref >60)
Glucose, Bld: 98 mg/dL (ref 65–99)
POTASSIUM: 3.9 mmol/L (ref 3.5–5.1)
SODIUM: 139 mmol/L (ref 135–145)

## 2016-10-23 LAB — GLUCOSE, CAPILLARY
GLUCOSE-CAPILLARY: 185 mg/dL — AB (ref 65–99)
Glucose-Capillary: 96 mg/dL (ref 65–99)

## 2016-10-23 LAB — PROTIME-INR
INR: 1.63
Prothrombin Time: 19.5 seconds — ABNORMAL HIGH (ref 11.4–15.2)

## 2016-10-23 LAB — TSH: TSH: 4.491 u[IU]/mL (ref 0.350–4.500)

## 2016-10-23 LAB — T4, FREE: FREE T4: 1.2 ng/dL — AB (ref 0.61–1.12)

## 2016-10-23 MED ORDER — ENOXAPARIN SODIUM 80 MG/0.8ML ~~LOC~~ SOLN: 80.0000 mg | Freq: Two times a day (BID) | SUBCUTANEOUS | 0 refills | Status: AC

## 2016-10-23 MED ORDER — DOCUSATE SODIUM 100 MG PO CAPS: 100.0000 mg | ORAL_CAPSULE | Freq: Two times a day (BID) | ORAL | 0 refills | Status: AC

## 2016-10-23 MED ORDER — POLYETHYLENE GLYCOL 3350 17 G PO PACK: 17.0000 g | PACK | Freq: Every day | ORAL | 0 refills | Status: AC | PRN

## 2016-10-23 MED ORDER — ATORVASTATIN CALCIUM 40 MG PO TABS: 40.0000 mg | ORAL_TABLET | Freq: Every day | ORAL | 1 refills | Status: AC

## 2016-10-23 MED ORDER — ENOXAPARIN SODIUM 80 MG/0.8ML ~~LOC~~ SOLN
1.0000 mg/kg | Freq: Two times a day (BID) | SUBCUTANEOUS | Status: DC
  Administered 2016-10-23: 80 mg via SUBCUTANEOUS
  Filled 2016-10-23 (×2): qty 0.8

## 2016-10-23 MED ORDER — WARFARIN SODIUM 5 MG PO TABS: 5.0000 mg | ORAL_TABLET | Freq: Every day | ORAL | 0 refills | Status: AC

## 2016-10-23 MED ORDER — UNABLE TO FIND: 0 refills | Status: AC

## 2016-10-23 MED ORDER — WARFARIN SODIUM 5 MG PO TABS: 5.0000 mg | ORAL_TABLET | Freq: Once | ORAL | Status: DC

## 2016-10-23 NOTE — Progress Notes (Signed)
Occupational Therapy Treatment Patient Details Name: Alexa Fox MRN: [id number] DOB: 05/25/1940 Today's Date: 10/23/2016    History of present illness Alexa Fox a 76 y.o.femalewith a past medical history of A. fib, diabetes, hypertension, stroke who presents with acute onset right-sided weakness and aphasia.  Patient with Lt MCA stroke and Afib.      PMH:  recent hospitalization 10/17/16-10/19/16 with the same symptoms - tPA given.    Afib, DM, HTN, TIA, CVA (9 yrs ago)   OT comments  Provided pt with education and tub transfer using tub bench. Pt performed transfer with Min guard A and Max VCs. Discussed transfer safety with family; family verbalized understanding. Provided HEP with theraputty. Pt required hand over hand A and Max VCs. Provided family with hand out of HEP for increased carry over at home. Answered pt and family questions in preparation for dc later today. Continue to recommend dc with HHOT.   Follow Up Recommendations  Home health OT;Supervision/Assistance - 24 hour    Equipment Recommendations  3 in 1 bedside commode    Recommendations for Other Services PT consult    Precautions / Restrictions Precautions Precautions: Fall Restrictions Weight Bearing Restrictions: No       Mobility Bed Mobility               General bed mobility comments: Pt sitting up in recliner upon arrival.   Transfers Overall transfer level: Needs assistance Equipment used: Rolling walker (2 wheeled) Transfers: Sit to/from Stand Sit to Stand: Supervision         General transfer comment: VC's for hand placement on seated surface for safety. Pt educated on safe transfer technique but requires VCs for follow through    Balance Overall balance assessment: Needs assistance Sitting-balance support: Feet supported;No upper extremity supported Sitting balance-Leahy Scale: Good     Standing balance support: No upper extremity supported Standing balance-Leahy Scale:  Fair                             ADL either performed or assessed with clinical judgement   ADL Overall ADL's : Needs assistance/impaired                                 Tub/ Shower Transfer: Min guard;Cueing for sequencing;Cueing for safety;Ambulation;Tub bench;Rolling walker Tub/Shower Transfer Details (indicate cue type and reason): Provided education on tub transfer. Pt performed with Min guard A. However, required Max VCs for sequencing. Discussed tub transfer with family to increase carry over at home.  Functional mobility during ADLs: Min guard;Rolling walker General ADL Comments: Discussed possible use of tub bench, which can be determined once pt is home.     Vision       Perception     Praxis      Cognition Arousal/Alertness: Awake/alert Behavior During Therapy: Flat affect;Impulsive Overall Cognitive Status: Impaired/Different from baseline Area of Impairment: Attention;Memory;Following commands;Safety/judgement;Awareness;Problem solving                 Orientation Level: Disoriented to;Time Current Attention Level: Selective Memory: Decreased short-term memory;Decreased recall of precautions Following Commands: Follows one step commands consistently;Follows one step commands with increased time;Follows multi-step commands inconsistently Safety/Judgement: Decreased awareness of safety;Decreased awareness of deficits Awareness: Emergent Problem Solving: Slow processing;Difficulty sequencing;Requires verbal cues;Requires tactile cues General Comments: Discussed decreased cogntition with family and encouraged them to use simple VCs when at  home.         Exercises Exercises: Other exercises Other Exercises Other Exercises: Theraputty excercises to increase R hand strength and in hand manipulation. Provided pt and family wiht hand out for home use. Pt requiring hand over hand assistance and Max VCs.  Other Exercises: [employer] pick up and  toss activity to increase pt motor planning and R hand strength   Shoulder Instructions       General Comments Family present for session. educated family on learned non-use and encouraged pt to use R hand for grooming and feeding.     Pertinent Vitals/ Pain       Pain Assessment: No/denies pain Pain Intervention(s): Monitored during session  Home Living                                          Prior Functioning/Environment              Frequency  Min 2X/week        Progress Toward Goals  OT Goals(current goals can now be found in the care plan section)  Progress towards OT goals: Progressing toward goals  Acute Rehab OT Goals Patient Stated Goal: Did not state OT Goal Formulation: With patient/family Time For Goal Achievement: 11/05/16 Potential to Achieve Goals: Good ADL Goals Pt Will Perform Eating: with modified independence;sitting Pt Will Perform Grooming: with modified independence;with adaptive equipment;sitting Pt Will Perform Upper Body Bathing: with modified independence;sitting Pt Will Perform Upper Body Dressing: with modified independence;sitting Pt Will Perform Tub/Shower Transfer: with supervision;ambulating;3 in 1 Additional ADL Goal #1: Pt will be independent with positioning and ROM exercises of R wrist and hand.   Plan Discharge plan remains appropriate    Co-evaluation                 AM-PAC PT "6 Clicks" Daily Activity     Outcome Measure   Help from another person eating meals?: A Little Help from another person taking care of personal grooming?: A Little Help from another person toileting, which includes using toliet, bedpan, or urinal?: A Little Help from another person bathing (including washing, rinsing, drying)?: A Little Help from another person to put on and taking off regular upper body clothing?: A Little Help from another person to put on and taking off regular lower body clothing?: A Little 6 Click  Score: 18    End of Session Equipment Utilized During Treatment: Gait belt;Rolling walker  OT Visit Diagnosis: Unsteadiness on feet (R26.81);Pain;Muscle weakness (generalized) (M62.81)   Activity Tolerance Patient tolerated treatment well   Patient Left in chair;with call bell/phone within reach;with chair alarm set;with family/visitor present   Nurse Communication Mobility status        Time: 1610-9604 OT Time Calculation (min): 33 min  Charges: OT General Charges $OT Visit: 1 Procedure OT Treatments $Self Care/Home Management : 8-22 mins $Therapeutic Activity: 8-22 mins  Talen Poser MSOT, OTR/L Acute Rehab Pager: [phone] Office: [phone]   Theodoro Grist Vianna Venezia 10/23/2016, 5:33 PM

## 2016-10-23 NOTE — Progress Notes (Signed)
STROKE TEAM PROGRESS NOTE   SUBJECTIVE (INTERVAL HISTORY) Her daughter is at the bedside.  Patient neuro stable, still has mild expressive aphasia and right hand weakness. INR 1.63. Heparin IV level fluctuate much, switched to lovenox subq. Repeat CT head no hemorrhagic transformation.    OBJECTIVE Temp:  [98.2 F (36.8 C)-99.3 F (37.4 C)] 98.6 F (37 C) (07/16 1018) Pulse Rate:  [68-95] 84 (07/16 1018) Cardiac Rhythm: Atrial fibrillation (07/16 0700) Resp:  [18-20] 20 (07/16 1018) BP: (107-126)/(51-69) 121/52 (07/16 1018) SpO2:  [92 %-99 %] 92 % (07/16 1018)  CBC:   Recent Labs Lab 10/17/16 2245  10/20/16 0441  10/22/16 0503 10/23/16 0212  WBC 9.0  < > 10.4  --  8.0 7.8  NEUTROABS 5.8  --  8.5*  --   --   --   HGB 13.3  < > 11.6*  < > 10.6* 10.5*  HCT 39.3  < > 35.1*  < > 31.8* 31.1*  MCV 92.9  < > 94.1  --  92.4 91.5  PLT 185  < > 118*  --  111* 101*  < > = values in this interval not displayed.  Basic Metabolic Panel:   Recent Labs Lab 10/22/16 0503 10/23/16 0212  NA 139 139  K 3.8 3.9  CL 109 107  CO2 23 24  GLUCOSE 111* 98  BUN 7 10  CREATININE 0.65 0.68  CALCIUM 8.4* 8.6*    Lipid Panel:     Component Value Date/Time   CHOL 140 10/20/2016 1615   TRIG 80 10/20/2016 1615   HDL 52 10/20/2016 1615   CHOLHDL 2.7 10/20/2016 1615   VLDL 16 10/20/2016 1615   LDLCALC 72 10/20/2016 1615   HgbA1c:  Lab Results  Component Value Date   HGBA1C 5.9 (H) 10/18/2016   Urine Drug Screen:     Component Value Date/Time   LABOPIA NONE DETECTED 10/20/2016 0715   COCAINSCRNUR NONE DETECTED 10/20/2016 0715   LABBENZ NONE DETECTED 10/20/2016 0715   AMPHETMU NONE DETECTED 10/20/2016 0715   THCU NONE DETECTED 10/20/2016 0715   LABBARB NONE DETECTED 10/20/2016 0715    Alcohol Level     Component Value Date/Time   ETH <5 10/20/2016 0441    IMAGING I have personally reviewed the radiological images below and agree with the radiology interpretations.  Ct  Angio Head W Or Wo Contrast Ct Angio Neck W And/or Wo Contrast 10/20/2016 1. Negative CTA for emergent large vessel occlusion.  2. Stable exam with mild atherosclerotic changes for patient age. No high-grade or flow-limiting stenosis.  3. Small layering bilateral pleural effusions with associated atelectasis.   Mr Brain Wo Contrast 10/20/2016 1. Moderate acute infarct affecting the left post more than precentral gyri.  2. 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   Dg Chest Port 1 View 10/21/2016 Improved lung aeration since the prior exam. No evidence of pneumonia or pulmonary edema.  10/20/2016 Mild cardiomegaly with vascular congestion.  No focal consolidation.    Ct Head Code Stroke W/o Cm  10/20/2016   1. Subtle loss of gray-white matter differentiation at the left frontal operculum and posterior left frontal region as above, suspicious for acute ischemic left MCA territory infarct. No intracranial hemorrhage.  2. ASPECTS is 8.  3. Stable atrophy with scattered remote infarcts as above.   Ct Head Code W/o Cm 10/22/2016 Increasingly well-defined LEFT hemisphere LEFT MCA territory cerebral infarction. No hemorrhagic transformation.  Transthoracic echocardiogram 10/19/2016  Study Conclusions - Left ventricle: The cavity size was normal. Wall thickness was   normal. Systolic function was normal. The estimated ejection   fraction was in the range of 55% to 60%. - Aortic valve: AV is thickened, calcified with mildly restricted   motion Peak and mean gradients through the valve are 12 and 6 mm   Hg respectively consistent with mild AS. There was mild   regurgitation. Valve area (VTI): 1.98 cm^2. Valve area (Vmax):   1.9 cm^2. Valve area (Vmean): 1.97 cm^2. - Mitral valve: MV is thickened with some restricted motion. Peak   and mean gradients through the valve are 12 and 6 mm Hg   respectively MVA by Pt1/2 is 1.44 cm2 consistent  with moderate   mitral stenosis. - Left atrium: The atrium was severely dilated. - Right atrium: The atrium was mildly dilated. - Tricuspid valve: There was moderate regurgitation. - Pulmonary arteries: PA peak pressure: 49 mm Hg (S).   PHYSICAL EXAM  Temp:  [98.2 F (36.8 C)-99.3 F (37.4 C)] 98.6 F (37 C) (07/16 1018) Pulse Rate:  [68-95] 84 (07/16 1018) Resp:  [18-20] 20 (07/16 1018) BP: (107-126)/(51-69) 121/52 (07/16 1018) SpO2:  [92 %-99 %] 92 % (07/16 1018)  General - Well nourished, well developed, in no apparent distress.  Ophthalmologic - Sharp disc margins OU.   Cardiovascular - irregularly irregular heart rate and rhythm.  Mental Status -  Level of arousal and orientation to time, place, and person were intact. Language including expression, naming, repetition, comprehension was assessed and found intact. Fund of Knowledge was assessed and was intact.  Cranial Nerves II - XII - II - Visual field intact OU. III, IV, VI - Extraocular movements intact. V - Facial sensation intact bilaterally. VII - mild right facial droop. VIII - Hearing & vestibular intact bilaterally. X - Palate elevates symmetrically. XI - Chin turning & shoulder shrug intact bilaterally. XII - Tongue protrusion intact.  Motor Strength - The patient's strength was normal in all extremities except right wrist extension and finger rate 3/5 with right hand dexterity difficulty and pronator drift was absent.  Bulk was normal and fasciculations were absent.   Motor Tone - Muscle tone was assessed at the neck and appendages and was normal.  Reflexes - The patient's reflexes were 1+ in all extremities and she had no pathological reflexes.  Sensory - Light touch, temperature/pinprick were assessed and were symmetrical.    Coordination - The patient had normal movements in the handswith no ataxia or dysmetria.  Tremor was absent.  Gait and Station - deferred to PT.   ASSESSMENT/PLAN Ms. Lazaria Schaben is a 76 y.o. female with history of TIAs, stroke 10/18/2016 treated with TPA, hypertension, diabetes mellitus, and atrial fibrillation on Coumadin with subtherapeutic INR on admission, presenting with headache, aphasia, and right-sided weakness. She did not receive IV t-PA due to anticoagulation and recent TPA therapy for stroke 10/18/2016.  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   Resultant  right facial droop, right hand weakness  CT head -  suspicious for acute ischemic left MCA territory infarct.  CT Head repeat - no hemorrhagic transformation  MRI head - moderate acute infarct affecting the left precentral gyri. Multiple remote infarcts.  CTA H&N - negative  Carotid Doppler - 10/18/2016 - No significant carotid artery stenosis demonstrated. Vertebrals are patent with antegrade flow.  2D Echo - 10/19/2016 - EF 55-60%. Severely dilated atrium but  no cardiac source of emboli identified.  LDL - 72  HgbA1c - 5.9  VTE prophylaxis - SCDs DIET - DYS 1 Room service appropriate? Yes; Fluid consistency: Thin  aspirin 81 mg daily and warfarin daily prior to admission, now on warfarin daily with lovenox bridge. Once INR 2-3, lovenox can be discontinued.  Patient counseled to be compliant with her antithrombotic medications  Ongoing aggressive stroke risk factor management  Therapy recommendations:  Home health OT/PT   Disposition: home today  Chronic A. fib on Coumadin  Recent admission for TIA due to subtherapeutic INR  Recurrent stroke again with subtherapeutic INR  Cardiology considered not NOAC candidate due to valvular A. Fib  Continue Coumadin, goal INR 2-3  (INR - 1.63 today)  On lovenox bridge. Once INR at goal, lovenox can be stopped  Hypertension  Blood pressure runs somewhat low  Permissive hypertension (OK if < 180/105) but gradually normalize in 5-7 days  Long-term BP goal  normotensive  Hyperlipidemia  Home meds:  Zocor 20 mg daily prior to admission  LDL 72, goal < 70  Now on Lipitor 40 mg daily  Continue statin at discharge  Diabetes  HgbA1c 5.9, goal < 7.0  Controlled  SSI  CBG monitoring  Other Stroke Risk Factors  Advanced age  Obesity, Body mass index is 32.9 kg/m., recommend weight loss, diet and exercise as appropriate   Hx stroke/TIA - admitted 10/17/16 for TIA, found to have some subtherapeutic INR, continue Coumadin and bridged with aspirin.  Other Active Problems  Pt leaves in [place], she will follow all her doctors locally. She is planning to back to [place] tomorrow.  Hospital day # 3  Neurology will sign off. Please call with questions. Pt will follow up with her doctors locally in [place]. Thanks for the consult.   Marvel Plan, MD PhD Stroke Neurology 10/23/2016 12:26 PM   To contact Stroke Continuity provider, please refer to [email or web address]. After hours, contact General Neurology

## 2016-10-23 NOTE — Progress Notes (Signed)
Patient left unit by wheelchair with all belongings accompanied by staff.

## 2016-10-23 NOTE — Progress Notes (Signed)
Heparin rate changed to 8.245ml/hr at 0316 per pharmacy  order

## 2016-10-23 NOTE — Progress Notes (Signed)
Occupational Therapy Treatment Patient Details Name: Alexa Fox MRN: [id number] DOB: 06-Apr-1941 Today's Date: 10/23/2016    History of present illness Alexa Fox a 76 y.o.femalewith a past medical history of A. fib, diabetes, hypertension, stroke who presents with acute onset right-sided weakness and aphasia.  Patient with Lt MCA stroke and Afib.      PMH:  recent hospitalization 10/17/16-10/19/16 with the same symptoms - tPA given.    Afib, DM, HTN, TIA, CVA (9 yrs ago)   OT comments  Apparently plan is to DC home with daughter in [place] when medically appropriate. Pt will need 24/7 S and HH service. Recommend HH Aide as well. Recommend 3in1 although daughter does not think she has room in this car. Discussed possibility of getting any DME through [facility] agency in [place]. Daughter requests another visit today to answer any questions regarding assisting her mother at home. Will return this pm. Feel pt safe to DC home when medically stable.   Follow Up Recommendations  Home health OT;Supervision/Assistance - 24 hour  HH Aide   Equipment Recommendations  3 in 1 bedside commode    Recommendations for Other Services      Precautions / Restrictions Precautions Precautions: Fall Restrictions Weight Bearing Restrictions: No       Mobility Bed Mobility               General bed mobility comments: Up in bathroom with NT  Transfers Overall transfer level: Needs assistance Equipment used: Rolling walker (2 wheeled) Transfers: Sit to/from Stand Sit to Stand: Supervision         General transfer comment: vc for safety. Pt "falls" into chair. - most likely did this at baseline.    Balance Overall balance assessment: Needs assistance Sitting-balance support: Feet supported;No upper extremity supported Sitting balance-Leahy Scale: Good     Standing balance support: No upper extremity supported Standing balance-Leahy Scale: Fair                              ADL either performed or assessed with clinical judgement   ADL Overall ADL's : Needs assistance/impaired Eating/Feeding: Minimal assistance;Sitting Eating/Feeding Details (indicate cue type and reason): Pt using tubing with R hand. Demonstrated daughter how to assist pt using hand over hand initially and gradually decreasing input to increase funcitonal use R hand Grooming: Minimal assistance                   Toilet Transfer: Min guard   Toileting- Clothing Manipulation and Hygiene: Min guard       Functional mobility during ADLs: Min guard;Rolling walker General ADL Comments: Discussed DC situation and plan is to DC home to daughter's home in [place]. Recommend 3in1 for tub use. Discussed possible use of tub bench, which can be determined once pt is home.     Vision       Perception     Praxis      Cognition Arousal/Alertness: Awake/alert Behavior During Therapy: Impulsive Overall Cognitive Status: Impaired/Different from baseline Area of Impairment: Following commands;Safety/judgement;Awareness;Problem solving;Attention                   Current Attention Level: Selective   Following Commands: Follows one step commands with increased time Safety/Judgement: Decreased awareness of safety;Decreased awareness of deficits Awareness: Emergent Problem Solving: Slow processing;Difficulty sequencing          Exercises     Shoulder Instructions  General Comments Daughter present throughout session    Pertinent Vitals/ Pain       Pain Assessment: No/denies pain Faces Pain Scale: Hurts a little bit Pain Location: Rt hand Pain Descriptors / Indicators: Aching;Sore Pain Intervention(s): Monitored during session  Home Living                                          Prior Functioning/Environment              Frequency  Min 2X/week        Progress Toward Goals  OT Goals(current goals can now be found in the care  plan section)  Progress towards OT goals: Progressing toward goals  Acute Rehab OT Goals Patient Stated Goal: to be more independent OT Goal Formulation: With patient/family Time For Goal Achievement: 11/05/16 Potential to Achieve Goals: Good ADL Goals Pt Will Perform Eating: with modified independence;sitting Pt Will Perform Grooming: with modified independence;with adaptive equipment;sitting Pt Will Perform Upper Body Bathing: with modified independence;sitting Pt Will Perform Upper Body Dressing: with modified independence;sitting Pt Will Perform Tub/Shower Transfer: with supervision;ambulating;3 in 1 Additional ADL Goal #1: Pt will be independent with positioning and ROM exercises of R wrist and hand.   Plan Discharge plan remains appropriate    Co-evaluation                 AM-PAC PT "6 Clicks" Daily Activity     Outcome Measure   Help from another person eating meals?: A Little Help from another person taking care of personal grooming?: A Little Help from another person toileting, which includes using toliet, bedpan, or urinal?: A Little Help from another person bathing (including washing, rinsing, drying)?: A Little Help from another person to put on and taking off regular upper body clothing?: A Little Help from another person to put on and taking off regular lower body clothing?: A Little 6 Click Score: 18    End of Session    OT Visit Diagnosis: Unsteadiness on feet (R26.81);Pain;Muscle weakness (generalized) (M62.81)   Activity Tolerance Patient tolerated treatment well   Patient Left in chair;with call bell/phone within reach;with chair alarm set;with family/visitor present   Nurse Communication Mobility status        Time: 1030-1104 OT Time Calculation (min): 34 min  Charges: OT General Charges $OT Visit: 1 Procedure OT Treatments $Self Care/Home Management : 8-22 mins $Neuromuscular Re-education: 8-22 mins  [facility] Hospital[facility] Ryllie Nieland, OT/L   [phone] 10/23/2016   [place]pton Wixom,HILLARY 10/23/2016, 11:28 AM

## 2016-10-23 NOTE — Progress Notes (Signed)
ANTICOAGULATION CONSULT NOTE Pharmacy Consult for Lovenox, warfarin Indication: atrial fibrillation  Allergies  Allergen Reactions  . Eggs Or Egg-Derived Products     Reaction unknown to patient/family    Patient Measurements: Height: 5\' 2"  (157.5 cm) Weight: 179 lb 14.3 oz (81.6 kg) IBW/kg (Calculated) : 50.1 Heparin Dosing Weight: 68.3 kg  Vital Signs: Temp: 98.7 F (37.1 C) (07/16 0520) Temp Source: Oral (07/16 0520) BP: 107/58 (07/16 0520) Pulse Rate: 68 (07/16 0520)  Labs:  Recent Labs  10/20/16 1615 10/20/16 1920  10/22/16 0503 10/22/16 0702 10/22/16 1606 10/23/16 0212  HGB  --   --   --  10.6*  --   --  10.5*  HCT  --   --   --  31.8*  --   --  31.1*  PLT  --   --   --  111*  --   --  101*  LABPROT  --   --   --  21.1*  --   --  19.5*  INR  --   --   --  1.80  --   --  1.63  HEPARINUNFRC  --   --   < > 1.28* 0.97* 0.26* <0.10*  <0.10*  CREATININE  --   --   --  0.65  --   --  0.68  TROPONINI <0.03 <0.03  --   --   --   --   --   < > = values in this interval not displayed.  Estimated Creatinine Clearance: 58.3 mL/min (by C-G formula based on SCr of 0.68 mg/dL).   Medical History: Past Medical History:  Diagnosis Date  . A-fib (HCC)   . Diabetes mellitus without complication (HCC)   . Hypertension   . Stroke (HCC)   . TIA (transient ischemic attack) 10/18/2016   no residual S/P tPA/notes 10/20/2016     Assessment: 76 y.o female with a history of atrial fibrillation, diabetes, and a stroke diagnosed on 7/11 and s/p TPA on 7/11.  She was on warfarin prior to that admission and on admit 7/11 INR was subtherapeutic. Current INR 1.63. Heparin was transitioned over to Lovenox this morning.    Goal of Therapy:  INR 2-3  Monitor platelets by anticoagulation protocol: Yes    Plan:  -Warfarin 5 mg po x1 -Daily INR -Continue Lovenox 80 mg [place] q12h    Agapito GamesAlison Garett Tetzloff, PharmD, BCPS Clinical Pharmacist 10/23/2016 8:13 AM

## 2016-10-23 NOTE — Discharge Instructions (Signed)
Dysphagia Diet Level 1, Pureed The dysphasia level 1 diet includes foods that are completely pureed and smooth. The foods have a pudding-like texture, such as the texture of pureed pancakes, mashed potatoes, and yogurt. The diet does not include foods with lumps or coarse textures. Liquids should be smooth and may either be thin, nectar-thick, honey-like, or spoon-thick. This diet is helpful for people with moderate to severe swallowing problems. It reduces the risk of food getting caught in the windpipe, trachea, or lungs. You may need help or supervision during meals while following this diet. What do I need to know about this diet? Foods  You may eat foods that are soft and have a pudding-like texture. If a food does not have this texture, you may be able to eat the food after: ? Pureeing it. This can be done with a blender or whisk. ? Moistening it with liquid. For example, you may have bread if you soak it in milk or syrup.  Avoid foods that are hard, dry, sticky, chunky, lumpy, or stringy. Also avoid foods with nuts, seeds, raisins, skins, and pulp.  Do not eat foods that you have to chew. If you have to chew the food, then you cannot eat it.  Eat a variety of foods to get all the nutrients you need. Liquids  You may drink liquids that are smooth. Your health care provider will tell you if you should drink thin or thickened liquids.  To thicken a liquid, use a food and beverage thickener or a thickening food. Thickened liquids are usually a pudding-like consistency.  Thin liquids include fruit juices, milk, coffee, tea, yogurts, shakes, and similar foods that melt to thin liquid at room temperature.  Avoid liquids with seeds, pulp, or chunks. See your dietitian or health care provider regularly for help with your dietary changes. What foods can I eat? Grains Store-bought soft breads, pancakes, and [place] toast that have a smooth, moist texture and do not have nuts or seeds (you  will need to moisten the food with liquid). Cooked cereals that have a pudding-like consistency, such as cream of wheat or farina (no oatmeal). Pureed, well-cooked pasta, rice, and plain bread stuffing. Vegetables Pureed vegetables. Soft avocado. Smooth tomato paste or sauce. Strained or pureed soups (these may need to be thickened as directed). Mashed or pureed potatoes without skin (can be seasoned with butter, smooth gravy, margarine, or sour cream). Fruits Pureed fruits such as melons and apples without seeds or pulp. Mashed bananas. Smooth tomato paste or sauce. Fruit juices without pulp or seeds. Strained or pureed soups. Meat and Other Protein Sources Pureed meat. Smooth pate or liverwurst. Smooth souffles. Pureed beans (such as lentils). Pureed eggs. Dairy Yogurt. Smooth cheese sauces. Milk (may need to be thickened). Nutritional dairy drinks or shakes. Ask your health care provider whether you can have ice cream. Condiments Finely ground salt, pepper, and other ground spices. Sweets/Desserts Smooth puddings and custards. Pureed desserts. Souffles. Whipped topping. Ask your health care provider whether you can have frozen desserts. Fats and Oils Butter. Margarine. Smooth and strained gravy. Sour cream. Mayonnaise. Cream cheese. Whipped topping. Smooth sauces (such as white sauce, cheese sauce, or hollandaise sauce). The items listed above may not be a complete list of recommended foods or beverages. Contact your dietitian for more options. What foods are not recommended? Grains Oatmeal. Dry cereals. Hard breads. Vegetables Whole vegetables. Stringy vegetables (such as celery). Thin tomato sauce. Fruits Whole fresh, frozen, canned, or dried fruits that have  not been pureed. Stringy fruits (such as pineapple). Meat and Other Protein Sources Whole or ground meat, fish, or poultry. Dried or cooked lentils or legumes that have been cooked but not mashed or pureed. Non-pureed eggs. Nuts and  seeds. Peanut butter. Dairy Non-pureed cheese. Dairy products with lumps or chunks. Ask your health care provider whether you can have ice cream. Condiments Coarse or seeded herbs and spices. Sweets/Desserts [place] preserves. Jams with seeds. Solid desserts. Sticky, chewy sweets (such as licorice and caramel). Ask your health care provider whether you can have frozen desserts. Fats and Oils Sauces of fats with lumps or chunks. The items listed above may not be a complete list of foods and beverages to avoid. Contact your dietitian for more information. This information is not intended to replace advice given to you by your health care provider. Make sure you discuss any questions you have with your health care provider. Document Released: 03/27/2005 Document Revised: 09/02/2015 Document Reviewed: 03/10/2013 Elsevier Interactive Patient Education  2017 [employer].   Ischemic Stroke An ischemic stroke is the sudden death of brain tissue. Blood carries oxygen to all areas of the body. This type of stroke happens when your blood does not flow to your brain like normal. Your brain cannot get the oxygen it needs. This is an emergency. It must be treated right away. Symptoms of a stroke usually happen all of a sudden. You may notice them when you wake up. They can include:  Weakness or loss of feeling in your face, arm, or leg. This often happens on one side of the body.  Trouble walking.  Trouble moving your arms or legs.  Loss of balance or coordination.  Feeling confused.  Trouble talking or understanding what people are saying.  Slurred speech.  Trouble seeing.  Seeing two of one object (double vision).  Feeling dizzy.  Feeling sick to your stomach (nauseous) and throwing up (vomiting).  A very bad headache for no reason.  Get help as soon as any of these problems start. This is important. Some treatments work better if they are given right away. These  include:  Aspirin.  Medicines to control blood pressure.  A shot (injection) of medicine to break up the blood clot.  Treatments given in the blood vessel (artery) to take out the clot or break it up.  Other treatments may include:  Oxygen.  Fluids given through an IV tube.  Medicines to thin out your blood.  Procedures to help your blood flow better.  What increases the risk? Certain things may make you more likely to have a stroke. Some of these are things that you can change, such as:  Being very overweight (obesity).  Smoking.  Taking birth control pills.  Not being active.  Drinking too much alcohol.  Using drugs.  Other risk factors include:  High blood pressure.  High cholesterol.  Diabetes.  Heart disease.  Being [place] American, Native [address], Hispanic, or [place] Native.  Being over age 2.  Family history of stroke.  Having had blood clots, stroke, or warning stroke (transient ischemic attack, TIA) in the past.  Sickle cell disease.  Being a woman with a history of high blood pressure in pregnancy (preeclampsia).  Migraine headache.  Sleep apnea.  Having an irregular heartbeat (atrial fibrillation).  Long-term (chronic) diseases that cause soreness and swelling (inflammation).  Disorders that affect how your blood clots.  Follow these instructions at home: Medicines  Take over-the-counter and prescription medicines only as told  by your doctor.  If you were told to take aspirin or another medicine to thin your blood, take it exactly as told by your doctor. ? Taking too much of the medicine can cause bleeding. ? If you do not take enough, it may not work as well.  Know the side effects of your medicines. If you are taking a blood thinner, make sure you: ? Hold pressure over any cuts for longer than usual. ? Tell your dentist and other doctors that you take this medicine. ? Avoid activities that may cause damage or injury to  your body. Eating and drinking  Follow instructions from your doctor about what you cannot eat or drink.  Eat healthy foods.  If you have trouble with swallowing, do these things to avoid choking: ? Take small bites when eating. ? Eat foods that are soft or pureed. Safety  Follow instructions from your health care team about physical activity.  Use a walker or cane as told by your doctor.  Keep your home safe so you do not fall. This may include: ? Having experts look at your home to make sure it is safe. ? Putting grab bars in the bedroom and bathroom. ? Using raised toilets. ? Putting a seat in the shower. General instructions  Do not use any tobacco products. ? Examples of these are cigarettes, chewing tobacco, and e-cigarettes. ? If you need help quitting, ask your doctor.  Limit how much alcohol you drink. This means no more than 1 drink a day for nonpregnant women and 2 drinks a day for men. One drink equals 12 oz of beer, 5 oz of wine, or 1 oz of hard liquor.  If you need help to stop using drugs or alcohol, ask your doctor to refer you to a program or specialist.  Stay active. Exercise as told by your doctor.  Keep all follow-up visits as told by your doctor. This is important. Get help right away if:  You suddenly: ? Have weakness or loss of feeling in your face, arm, or leg. ? Feel confused. ? Have trouble talking or understanding what people are saying. ? Have trouble seeing. ? Have trouble walking. ? Have trouble moving your arms or legs. ? Feel dizzy. ? Lose your balance or coordination. ? Have a very bad headache and you do not know why.  You pass out (lose consciousness) or almost pass out.  You have jerky movements that you cannot control (seizure). These symptoms may be an emergency. Do not wait to see if the symptoms will go away. Get medical help right away. Call your local emergency services (911 in the [place] Do not drive yourself to the  hospital. This information is not intended to replace advice given to you by your health care provider. Make sure you discuss any questions you have with your health care provider. Document Released: 03/16/2011 Document Revised: 09/07/2015 Document Reviewed: 06/23/2015 Elsevier Interactive Patient Education  2018 [employer].  Scheduled Meds:   stroke: mapping our early stages of recovery book   Does not apply Once   atorvastatin  40 mg Oral q1800   docusate sodium  100 mg Oral BID   enoxaparin (LOVENOX) injection  1 mg/kg Subcutaneous Q12H   feeding supplement (ENSURE ENLIVE)  237 mL Oral BID BM   insulin aspart  0-9 Units Subcutaneous TID WC   metoprolol tartrate  50 mg Oral BID   pantoprazole  40 mg Oral Q1200   polyethylene glycol  17  g Oral Daily   warfarin  5 mg Oral ONCE-1800   Warfarin - Pharmacist Dosing Inpatient   Does not apply q1800   Continuous Infusions: PRN Meds:acetaminophen **OR** acetaminophen (TYLENOL) oral liquid 160 mg/5 mL **OR** acetaminophen, hydrALAZINE, LORazepam, metoprolol tartrate

## 2016-10-23 NOTE — Care Management Note (Addendum)
Case Management Note  Patient Details  Name: Alexa Fox MRN: [id number] Date of Birth: 08/24/1940  Subjective/Objective:   Presents with CVA, she is for dc today, will go home with [place] in [place] , then tomorrow she will go home with daughter who lives at address [address], [place] [id number]. Daughter at bedside gave NCM the name of HH agency in [place] , [facility], [facility] [place] [phone].  NCM called and spoke with Marylu LundJanet and [facility], [facility] and aide.  Soc will start midweek .  [facility]ax [facility] [phone].  NCM will fax information over , notified MD for orders.  NCM [facility]  [facility].  D[facility] because they will not have enough room in the car with the luggage, they will get a [place]SC in [place]. NCM received a call from pt stating that daughter has changed her mind about getting the three prong cane.  NCM notified Clydie BraunKaren with [facility].                 Action/Plan: Patient for dc with HH services today with [facility] for HHPT, OT, Aide , ST, and 3 prong cane.   Expected Discharge Date:  10/23/16               Expected Discharge Plan:  Home w Home Health Services  In-House Referral:  NA  Discharge planning Services  CM Consult  Post Acute Care Choice:  Durable Medical Equipment, Home Health Choice offered to:  Adult Children  DME Arranged:  [facility]mer Morane DME Agency:  [facility]  HH Arranged:  PT, OT, Nurse's Aide, Speech Therapy HH Agency:  Other - See comment  Status of Service:  Completed, signed off  If discussed at Long Length of Stay Meetings, dates discussed:    Additional Comments:  Leone Havenaylor, Rhayne Chatwin [place], RN 10/23/2016, 1:19 PM

## 2016-10-23 NOTE — Progress Notes (Signed)
Physical Therapy Treatment Patient Details Name: Alexa Fox MRN: [id number] DOB: 25-Jul-1940 Today's Date: 10/23/2016    History of Present Illness Alexa Fox a 76 y.o.femalewith a past medical history of A. fib, diabetes, hypertension, stroke who presents with acute onset right-sided weakness and aphasia.  Patient with Lt MCA stroke and Afib.      PMH:  recent hospitalization 10/17/16-10/19/16 with the same symptoms - tPA given.    Afib, DM, HTN, TIA, CVA (9 yrs ago)    PT Comments    Pt progressing towards physical therapy goals. Pt and family were educated on stair training, transfers, and appropriate use of DME this session. Attempted ambulation with SPC and pt had difficulty with sequencing and demonstrated x2 LOB. HHA provided and pt appears to move more confidently and comfortably than with SPC or RW. Family reports she will have 24 hour assist upon d/c - feel HHA is a safer option until HHPT can work with pt to improve safety. Family reports they will get any other necessary DME once back in [place]. Will continue to follow and progress as able per POC.   Follow Up Recommendations  Home health PT;Supervision/Assistance - 24 hour     Equipment Recommendations  None recommended by PT    Recommendations for Other Services       Precautions / Restrictions Precautions Precautions: Fall Restrictions Weight Bearing Restrictions: No    Mobility  Bed Mobility               General bed mobility comments: Pt sitting up in recliner upon PT arrival.   Transfers Overall transfer level: Needs assistance Equipment used: Rolling walker (2 wheeled) Transfers: Sit to/from Stand Sit to Stand: Supervision         General transfer comment: VC's for hand placement on seated surface for safety. Pt says "ok" or nods head "yes" when PT educating but then does not follow through and demonstrate understanding. Practiced x5 before pt demonstrated proper hand placement.    Ambulation/Gait Ambulation/Gait assistance: Min guard;Min assist Ambulation Distance (Feet): 300 Feet Assistive device: 1 person hand held assist;Rolling walker (2 wheeled);Straight cane Gait Pattern/deviations: Step-through pattern;Decreased stride length;Shuffle Gait velocity: Decreased Gait velocity interpretation: Below normal speed for age/gender General Gait Details: Pt initially ambulating with the RW as she reports she prefers it. Noted R hand inattention and hand falling off the walker at times. Frequent cues to correct grip on walker. Noted overall poor posture and improper trunk flexion with increased walker proximity. Family states they are going to take a 4 pronged cane home instead of the walker upon return to the room (apparently discussed with case manager). Practiced with a SPC and pt with 2 large losses of balance requiring assist to recover. Pt ambulated the rest of the way back to the room with HHA from therapist and appeared much more comfortable and confident. Feel the sequencing of the [facility] was hindering her balance and safety.    Stairs Stairs: Yes   Stair Management: One rail Left;Step to pattern;Forwards (HHA) Number of Stairs: 5 General stair comments: Pt practiced first with L railing for support, and then L HHA from therapist to simulate home environment. Daughter present at end of stair training and educated on proper assist needed.   Wheelchair Mobility    Modified Rankin (Stroke Patients Only) Modified Rankin (Stroke Patients Only) Pre-Morbid Rankin Score: No significant disability     Balance Overall balance assessment: Needs assistance Sitting-balance support: Feet supported;No upper extremity supported Sitting balance-Leahy  Scale: Good     Standing balance support: No upper extremity supported Standing balance-Leahy Scale: Fair                              Cognition Arousal/Alertness: Awake/alert Behavior During Therapy: Flat  affect;Impulsive Overall Cognitive Status: Impaired/Different from baseline Area of Impairment: Attention;Memory;Following commands;Safety/judgement;Awareness;Problem solving                   Current Attention Level: Selective Memory: Decreased short-term memory;Decreased recall of precautions Following Commands: Follows one step commands consistently;Follows one step commands with increased time;Follows multi-step commands inconsistently Safety/Judgement: Decreased awareness of safety;Decreased awareness of deficits Awareness: Emergent Problem Solving: Slow processing;Difficulty sequencing;Requires verbal cues;Requires tactile cues        Exercises      General Comments General comments (skin integrity, edema, etc.): 3 daughters and a son-in-law persent. Family education provided for tub transfer, stair training, and appropriate guarding/assist for ambulation.       Pertinent Vitals/Pain Pain Assessment: No/denies pain    Home Living                      Prior Function            PT Goals (current goals can now be found in the care plan section) Acute Rehab PT Goals Patient Stated Goal: Did not state PT Goal Formulation: With patient/family Time For Goal Achievement: 10/28/16 Potential to Achieve Goals: Good Progress towards PT goals: Progressing toward goals    Frequency    Min 3X/week      PT Plan Current plan remains appropriate    Co-evaluation              AM-PAC PT "6 Clicks" Daily Activity  Outcome Measure  Difficulty turning over in bed (including adjusting bedclothes, sheets and blankets)?: None Difficulty moving from lying on back to sitting on the side of the bed? : A Little Difficulty sitting down on and standing up from a chair with arms (e.g., wheelchair, bedside commode, etc,.)?: A Little Help needed moving to and from a bed to chair (including a wheelchair)?: A Little Help needed walking in hospital room?: A Little Help  needed climbing 3-5 steps with a railing? : A Lot 6 Click Score: 18    End of Session Equipment Utilized During Treatment: Gait belt Activity Tolerance: Patient tolerated treatment well;Patient limited by fatigue Patient left: in chair;with call bell/phone within reach;with chair alarm set;with family/visitor present Nurse Communication: Mobility status PT Visit Diagnosis: Unsteadiness on feet (R26.81);Muscle weakness (generalized) (M62.81)     Time: 1322-1410 PT Time Calculation (min) (ACUTE ONLY): 48 min  Charges:  $Gait Training: 23-37 mins $Therapeutic Activity: 8-22 mins                    G Codes:       Conni SlipperLaura Brayn Eckstein, PT, DPT Acute Rehabilitation Services Pager: [phone]    Marylynn PearsonLaura D Tashai Catino 10/23/2016, 2:38 PM

## 2016-10-23 NOTE — Progress Notes (Addendum)
ANTICOAGULATION CONSULT NOTE - follow up Pharmacy Consult for Lovenox and Coumadin  Indication: atrial fibrillation, recurrent stroke with low INR   Allergies  Allergen Reactions  . Eggs Or Egg-Derived Products     Reaction unknown to patient/family    Patient Measurements: Height: 5\' 2"  (157.5 cm) Weight: 179 lb 14.3 oz (81.6 kg) IBW/kg (Calculated) : 50.1 Heparin Dosing Weight: 68.3 kg  Vital Signs: Temp: 98.8 F (37.1 C) (07/16 0108) Temp Source: Oral (07/16 0108) BP: 113/51 (07/16 0108) Pulse Rate: 68 (07/16 0108)  Labs:  Recent Labs  10/20/16 0441 10/20/16 0452 10/20/16 0731 10/20/16 1615 10/20/16 1920  10/22/16 0503 10/22/16 0702 10/22/16 1606 10/23/16 0212  HGB 11.6* 11.9*  --   --   --   --  10.6*  --   --  10.5*  HCT 35.1* 35.0*  --   --   --   --  31.8*  --   --  31.1*  PLT 118*  --   --   --   --   --  111*  --   --  101*  APTT 34  --   --   --   --   --   --   --   --   --   LABPROT 17.8*  --   --   --   --   --  21.1*  --   --  19.5*  INR 1.46  --   --   --   --   --  1.80  --   --  1.63  HEPARINUNFRC  --   --   --   --   --   < > 1.28* 0.97* 0.26* <0.10*  <0.10*  CREATININE 0.81 0.70  --   --   --   --  0.65  --   --  0.68  TROPONINI  --   --  <0.03 <0.03 <0.03  --   --   --   --   --   < > = values in this interval not displayed.  Estimated Creatinine Clearance: 58.3 mL/min (by C-G formula based on SCr of 0.68 mg/dL).   Medical History: Past Medical History:  Diagnosis Date  . A-fib (HCC)   . Diabetes mellitus without complication (HCC)   . Hypertension   . Stroke (HCC)   . TIA (transient ischemic attack) 10/18/2016   no residual S/P tPA/notes 10/20/2016     Assessment: 76 y.o female with a history of  atrial fibrillation, diabetes, and a stroke diagnosed on 7/11 and  s/p TPA 7/11 at 0016. She was on coumadin prior to that admission and on admit 7/11 INR was subtherapeutic.   Heparin level low and no issues reported by nursing. No  further bleeding.  PTA Coumadin dose: 5 mg daily  Goal of Therapy:  INR 2-3  Heparin level  0.3-0.5  Monitor platelets by anticoagulation protocol: Yes   Plan:  Increase heparin gtt to 850 units/hr Check 8 hr heparin level Monitor daily heparin level, CBC, s/s of bleed  ADDENDUM:   Transitioning from heparin to enoxaparin.  Plan: Stop heparin gtt Start enoxaparin 80mg  [place] Q12h Monitor daily INR, CBC, s/s of bleed F/U Coumadin dose later today   Enzo BiNathan Nylee Barbuto, PharmD, [facility] Clinical Pharmacist Pager [phone] 10/23/2016 3:13 AM

## 2016-10-23 NOTE — Discharge Summary (Signed)
[facility]  Physician Discharge Summary   Patient ID: Alexa Fox MRN: [id number] DOB/AGE: [id number] 78 y.o.  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 Patient provided instructions to take her medications and to have PT/INR checked before the end of the week when she gets back to her home town.  DISCHARGE CONDITION: fair  Diet recommendation: Dysphagia 1 diet with thin liquids  Filed Weights   10/20/16 0457 10/20/16 1615  Weight: 84.9 kg (187 lb 2.7 oz) 81.6 kg (179 lb 14.3 oz)    INITIAL HISTORY: 76 year old Caucasian female with a past medical history of atrial fibrillation, diabetes, essential hypertension, history of TIA recently hospitalized for acute stroke requiring TPA on 7/11. She was discharged home by neurology without any residual symptoms. She presented back to the hospital on the day of admission after waking up with slurred speech, right facial droop and right arm weakness. She was hospitalized for further management.  Consultations:  Neurology  Cardiology    HOSPITAL COURSE:   Acute stroke Patient recently hospitalized and underwent TPA on 7/11. Presented back with worsening symptoms. She was diagnosed with a new stroke. She has a history of atrial fibrillation and is on warfarin. INR was subtherapeutic. Neurology was consulted. She was also seen by cardiology. Warfarin is the only anticoagulant appropriate for her. Neurology started her on a heparin bridge and then changed it over to subcutaneous Lovenox. INR remains subtherapeutic. Patient plans  to return back to [place] and follow-up with her doctors there. She has been told to have her PT/INR checked either on Thursday or Friday. Continue statin, she has been changed over to atorvastatin. She has been seen by physical and occupational therapy. Home health has been recommended. She was also seen by speech therapy and is on a dysphagia diet Home health has been ordered. Case manager to fax this over to [facility] agency in [place]. Seen also by speech therapy. She is on a dysphagia diet. Speech therapy to also follow patient via home health.  Essential hypertension. Blood pressure remains stable.  Hyperlipidemia. LDL 86. Continue statin. She was changed over to atorvastatin.  Atrial fibrillation with RVR She needs to be on anticoagulation due to high chads2vasc score of 7. 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 She was started back on her metoprolol. Heart rate is reasonably well controlled. She did have echocardiogram during her last hospitalization which showed normal systolic function. She does have moderate mitral stenosis. Patient has a history of rheumatic fever. Patient mentioned that she's had problems controlling her INR the last 6 weeks. She is originally from [place] and is visiting family in [place]. TSH was normal. Free T4 was 1.2. Patient instructed to have these rechecked at [place].  History of type 2 diabetes mellitus. Stable. Continue home medications  Mild thrombocytopenia. No evidence of bleeding. Counts are slightly lower than what they were during her previous hospitalization. Counts are stable.  Normocytic anemia. Mild drop in hemoglobin, likely dilutional. No evidence of bleeding.  Overall, stable. Okay for discharge home today. Discussed with Dr. Roda Shutters with neurology.  PERTINENT LABS:  The results of significant diagnostics from this hospitalization (including imaging, microbiology, ancillary and laboratory) are listed below for reference.     Microbiology: Recent Results (from the past 240 hour(s))  MRSA PCR Screening     Status: None   Collection Time: 10/18/16  1:56 AM  Result Value Ref Range Status   MRSA by PCR NEGATIVE NEGATIVE Final    Comment:        The GeneXpert MRSA Assay (FDA approved for NASAL specimens only), is one component of a comprehensive MRSA colonization surveillance program. It is not intended to diagnose MRSA infection nor to guide or monitor treatment for MRSA infections.   MRSA PCR Screening     Status: None   Collection Time: 10/20/16  4:18 PM  Result Value Ref Range Status   MRSA by PCR NEGATIVE NEGATIVE Final    Comment:        The GeneXpert MRSA Assay (FDA approved for NASAL specimens only), is one component of a comprehensive MRSA colonization surveillance program. It is not intended to diagnose MRSA infection nor to guide or monitor treatment for MRSA infections.      Labs: Basic Metabolic Panel:  Recent Labs Lab 10/17/16 2245 10/18/16 0428 10/20/16 0441 10/20/16 0452 10/22/16 0503 10/23/16 0212  NA 138 140 136 142 139 139  K 3.8 4.1 4.3 4.4 3.8 3.9  CL 102 109 111 107 109 107  CO2 28 20* 22  --  23 24  GLUCOSE 171* 126* 139* 140* 111* 98  BUN 29* 24* 9 10 7 10   CREATININE 1.04* 0.92 0.81 0.70 0.65 0.68  CALCIUM 9.3 8.6* 8.7*  --  8.4* 8.6*   Liver Function Tests:  Recent Labs Lab 10/17/16 2245 10/20/16 0441  AST 22 17  ALT 23 16  ALKPHOS 55 43  BILITOT 0.5 1.2  PROT 7.1 5.7*  ALBUMIN 4.0 3.6   CBC:  Recent Labs Lab 10/17/16 2245 10/18/16 0428 10/20/16 0441 10/20/16 0452 10/22/16 0503 10/23/16 0212  WBC 9.0 9.7 10.4  --  8.0 7.8  NEUTROABS 5.8  --  8.5*  --   --   --   HGB 13.3 12.5 11.6* 11.9* 10.6* 10.5*  HCT 39.3 38.6 35.1* 35.0* 31.8* 31.1*  MCV 92.9 93.5 94.1  --  92.4 91.5  PLT 185 140* 118*  --  111* 101*   Cardiac Enzymes:  Recent Labs Lab 10/19/16 0635 10/19/16 1101 10/20/16 0731 10/20/16 1615 10/20/16 1920  TROPONINI  <0.03 <0.03 <0.03 <0.03 <0.03   BNP: BNP (last 3 results)  Recent Labs  10/20/16 0637  BNP 511.1*    CBG:  Recent Labs Lab 10/22/16 1653 10/22/16 2113 10/22/16 2223 10/23/16 0626 10/23/16 1144  GLUCAP 130* 69 109* 96 185*     IMAGING STUDIES Ct Angio Head W Or Wo Contrast  Result Date: 10/20/2016 CLINICAL DATA:  Initial evaluation for acute stroke, right-sided weakness, slurred speech. EXAM: CT ANGIOGRAPHY HEAD AND NECK TECHNIQUE: Multidetector CT imaging of the head and neck was performed using the standard protocol during bolus administration of intravenous contrast. Multiplanar CT image reconstructions and MIPs were obtained to evaluate the vascular anatomy. Carotid stenosis measurements (when applicable) are obtained utilizing NASCET criteria, using the distal internal carotid diameter as the denominator. CONTRAST:  50 cc of Isovue 370. COMPARISON:  Prior CT from earlier same day as well as recent CTA from 10/18/2016. FINDINGS: CTA NECK FINDINGS Aortic arch: Aortic arch of normal caliber with normal 3 vessel morphology. No  flow-limiting stenosis about the origin of the great vessels. Visualized subclavian artery is widely patent. Right carotid system: Right common and internal carotid artery's widely patent without stenosis, dissection, or occlusion. No significant atheromatous narrowing about the right carotid bifurcation. Left carotid system: Left common and internal carotid artery's are widely patent without stenosis, dissection, or occlusion. Mild calcified plaque at the proximal left ICA without stenosis. Vertebral arteries: Both of the vertebral arteries arise from the subclavian arteries. Vertebral arteries patent within the neck without stenosis, dissection, or occlusion. Skeleton: No acute osseus abnormality. No worrisome lytic or blastic osseous lesions. Moderate degenerative spondylolysis at C4-5 through C6-7. Other neck: No acute soft tissue abnormality within the neck.  Salivary glands normal. Thyroid normal. No adenopathy. Upper chest: Small layering bilateral pleural effusions partially visualized. Associated atelectasis. Emphysema. Review of the MIP images confirms the above findings CTA HEAD FINDINGS Anterior circulation: Petrous segments widely patent bilaterally. Cavernous and supraclinoid segments widely patent bilaterally without flow-limiting stenosis. Mild scattered atheromatous plaque noted within the cavernous right ICA. ICA termini patent. A1 segments patent. Anterior communicating artery normal. Anterior cerebral arteries patent to their distal aspects. M1 segments patent without stenosis or occlusion. No proximal M2 occlusion. Distal MCA branches well opacified and symmetric. Posterior circulation: Vertebral artery's patent to the vertebrobasilar junction without stenosis. Patent left PICA. Right PICA not visualized. Basilar artery widely patent. Superior cerebral arteries patent bilaterally. Both of the posterior cerebral arteries primarily supplied via the basilar and are patent to their distal aspects. Venous sinuses: Patent. Anatomic variants: No significant anatomic variant. No aneurysm or vascular malformation. Delayed phase: Not performed. Review of the MIP images confirms the above findings IMPRESSION: 1. Negative CTA for emergent large vessel occlusion. 2. Stable exam with mild atherosclerotic changes for patient age. No high-grade or flow-limiting stenosis. 3. Small layering bilateral pleural effusions with associated atelectasis. Critical Value/emergent results were called by telephone at the time of interpretation on 10/20/2016 at 5:42 am to Dr. Levander Campion, who verbally acknowledged these results. Electronically Signed   By: Rise Mu M.D.   On: 10/20/2016 06:04   Ct Angio Head W Or Wo Contrast  Result Date: 10/18/2016 CLINICAL DATA:  Initial evaluation for acute right arm weakness, status post tPA. EXAM: CT ANGIOGRAPHY HEAD AND NECK  TECHNIQUE: Multidetector CT imaging of the head and neck was performed using the standard protocol during bolus administration of intravenous contrast. Multiplanar CT image reconstructions and MIPs were obtained to evaluate the vascular anatomy. Carotid stenosis measurements (when applicable) are obtained utilizing NASCET criteria, using the distal internal carotid diameter as the denominator. CONTRAST:  50 cc of Isovue 370. COMPARISON:  Prior CT from 10/17/2016. FINDINGS: CTA NECK FINDINGS Aortic arch: Visualized aortic arch of normal caliber with normal 3 vessel morphology. Minimal plaque about the origin of the great vessels without flow-limiting stenosis. Visualized subclavian artery is widely patent. Right carotid system: Right common and internal carotid artery's are widely patent without stenosis, dissection, or occlusion. No significant atheromatous narrowing about the right carotid bifurcation. Left carotid system: Left common carotid artery widely patent from its origin to the bifurcation. Mild a centric calcified plaque about the left carotid bifurcation without stenosis. Left ICA widely patent distally to the skullbase without stenosis, dissection, or occlusion. Vertebral arteries: Both of the vertebral arteries arise from the subclavian arteries. Vertebral arteries widely patent within the neck without stenosis, dissection, or occlusion. Skeleton: No acute osseous abnormality. No worrisome lytic or blastic osseous lesions. Moderate degenerative spondylolysis present at  C5-6 and C6-7. Multilevel facet arthrosis noted within the upper cervical spine. Other neck: Soft tissues of the neck demonstrate no acute abnormality. Salivary glands within normal limits. No adenopathy. Thyroid normal. Upper chest: Visualized upper chest within normal limits. Partially visualized lungs are clear. Review of the MIP images confirms the above findings CTA HEAD FINDINGS Anterior circulation: Petrous, cavernous, and  supraclinoid segments of the internal carotid arteries are widely patent without flow-limiting stenosis. Minimal scattered plaque within the cavernous right ICA noted. ICA termini widely patent. A1 segments patent bilaterally. Anterior communicating artery normal. Anterior cerebral arteries patent to their distal aspects. M1 segments patent without stenosis or occlusion. No proximal M2 occlusion. Distal MCA branches well opacified and symmetric. Posterior circulation: Vertebral artery's widely patent to the vertebrobasilar junction. Left PICA patent. Dominant right AICA. Basilar artery widely patent. Superior cerebral arteries patent bilaterally. Both of the posterior cerebral artery supplied via the basilar and are widely patent to their distal aspects. Small left posterior communicating artery noted. Venous sinuses: Patent. Anatomic variants: No significant anatomic variant. No aneurysm or vascular malformation. Delayed phase: Not performed. Review of the MIP images confirms the above findings IMPRESSION: 1. Negative CTA for emergent large vessel occlusion. 2. Minor atherosclerotic changes for patient age as above. No high-grade or flow-limiting stenosis. Critical Value/emergent results were discussed by telephone on 10/18/2016 at approximately 12:50 a.m. with Dr. Ritta Slot . Electronically Signed   By: Rise Mu M.D.   On: 10/18/2016 01:25   Ct Head Wo Contrast  Result Date: 10/22/2016 CLINICAL DATA:  Continued surveillance of stroke. EXAM: CT HEAD WITHOUT CONTRAST TECHNIQUE: Contiguous axial images were obtained from the base of the skull through the vertex without intravenous contrast. COMPARISON:  MR brain 10/20/2016. FINDINGS: Brain: Continued evolution of well-defined cytotoxic edema affecting primarily the postcentral gyrus, and LEFT frontal opercular cortex adjacent to the sylvian fissure. No hemorrhage. No midline shift. Vascular: No hyperdense vessel or unexpected calcification.  Skull: Normal. Negative for fracture or focal lesion. Sinuses/Orbits: No acute finding. Other: None. IMPRESSION: Increasingly well-defined LEFT hemisphere LEFT MCA territory cerebral infarction. No hemorrhagic transformation. Electronically Signed   By: Elsie Stain M.D.   On: 10/22/2016 14:45   Ct Head Wo Contrast  Result Date: 10/18/2016 CLINICAL DATA:  Initial evaluation for pupillary difference status post tPA. EXAM: CT HEAD WITHOUT CONTRAST TECHNIQUE: Contiguous axial images were obtained from the base of the skull through the vertex without intravenous contrast. COMPARISON:  Prior CT 10/17/2016 is well CTA from earlier the same day. FINDINGS: Brain: Stable atrophy with mild chronic small vessel ischemic disease. Remote lacunar infarct present within the right basal ganglia. Scatter remote bilateral cerebellar infarcts again noted. Remote posterior left MCA territory cortical infarct. No acute intracranial hemorrhage status post tPA administration. Previously questioned hypodensity within the left occipital pole not seen on this exam. No acute or evolving large vessel territory infarct identified. No mass lesion, midline shift or mass effect. No hydrocephalus. No extra-axial fluid collection. Vascular: No asymmetric hyperdense vessel. Skull: Scalp soft tissues and calvarium are unchanged, and remain within normal limits. Sinuses/Orbits: Globes normal soft tissues within normal limits. Patient status post lens extraction bilaterally. Paranasal sinuses and mastoids remain clear. IMPRESSION: 1. No acute intracranial hemorrhage or other abnormality identified status post tPA. 2. No other acute intracranial process identified. Previously questioned hypodensity at the left occipital pole is not seen on this exam. This was most likely artifactual on prior study. 3. Stable atrophy with scattered remote infarcts as above.  Electronically Signed   By: Rise Mu M.D.   On: 10/18/2016 03:46   Ct Head Wo  Contrast  Result Date: 10/17/2016 CLINICAL DATA:  Initial evaluation for acute right arm weakness. EXAM: CT HEAD WITHOUT CONTRAST TECHNIQUE: Contiguous axial images were obtained from the base of the skull through the vertex without intravenous contrast. COMPARISON:  None. FINDINGS: Brain: Age-related cerebral atrophy with mild chronic small vessel ischemic disease. Scatter remote lacunar infarcts present within the right basal ganglia. Remote bilateral cerebellar infarcts present. Encephalomalacia within the left parietal lobe consistent with remote left posterior MCA territory infarct. No acute intracranial hemorrhage. There is question of subtle asymmetric hypodensity involving the left occipital pole, which may reflect an evolving acute left PCA territory infarct. No other evidence for acute large vessel territory ischemia. No mass lesion or midline shift. No hydrocephalus. No extra-axial fluid collection. Vascular: No hyperdense vessel. Scattered vascular calcifications noted within the carotid siphons. Skull: Scalp soft tissues within normal limits.  Calvarium intact. Sinuses/Orbits: Globes and orbital soft tissues within normal limits. Paranasal sinuses and mastoids are clear. ASPECTS [facility] Stroke Program Early CT Score) - Ganglionic level infarction (caudate, lentiform nuclei, internal capsule, insula, M1-M3 cortex): 7 - Supraganglionic infarction (M4-M6 cortex): 3 Total score (0-10 with 10 being normal): 10 IMPRESSION: 1. Question subtle evolving hypodensity within the left occipital pole, suspicious for acute left PCA territory infarct. No acute intracranial hemorrhage. 2. ASPECTS is 10 3. Age-related cerebral atrophy with mild chronic small vessel ischemic disease with scattered remote infarcts as above. Critical Value/emergent results were called by telephone at the time of interpretation on 10/17/2016 at 11:17 pm to Dr. Arby Barrette , who verbally acknowledged these results. Electronically Signed    By: Rise Mu M.D.   On: 10/17/2016 23:21   Ct Angio Neck W And/or Wo Contrast  Result Date: 10/20/2016 CLINICAL DATA:  Initial evaluation for acute stroke, right-sided weakness, slurred speech. EXAM: CT ANGIOGRAPHY HEAD AND NECK TECHNIQUE: Multidetector CT imaging of the head and neck was performed using the standard protocol during bolus administration of intravenous contrast. Multiplanar CT image reconstructions and MIPs were obtained to evaluate the vascular anatomy. Carotid stenosis measurements (when applicable) are obtained utilizing NASCET criteria, using the distal internal carotid diameter as the denominator. CONTRAST:  50 cc of Isovue 370. COMPARISON:  Prior CT from earlier same day as well as recent CTA from 10/18/2016. FINDINGS: CTA NECK FINDINGS Aortic arch: Aortic arch of normal caliber with normal 3 vessel morphology. No flow-limiting stenosis about the origin of the great vessels. Visualized subclavian artery is widely patent. Right carotid system: Right common and internal carotid artery's widely patent without stenosis, dissection, or occlusion. No significant atheromatous narrowing about the right carotid bifurcation. Left carotid system: Left common and internal carotid artery's are widely patent without stenosis, dissection, or occlusion. Mild calcified plaque at the proximal left ICA without stenosis. Vertebral arteries: Both of the vertebral arteries arise from the subclavian arteries. Vertebral arteries patent within the neck without stenosis, dissection, or occlusion. Skeleton: No acute osseus abnormality. No worrisome lytic or blastic osseous lesions. Moderate degenerative spondylolysis at C4-5 through C6-7. Other neck: No acute soft tissue abnormality within the neck. Salivary glands normal. Thyroid normal. No adenopathy. Upper chest: Small layering bilateral pleural effusions partially visualized. Associated atelectasis. Emphysema. Review of the MIP images confirms the  above findings CTA HEAD FINDINGS Anterior circulation: Petrous segments widely patent bilaterally. Cavernous and supraclinoid segments widely patent bilaterally without flow-limiting stenosis. Mild scattered atheromatous plaque  noted within the cavernous right ICA. ICA termini patent. A1 segments patent. Anterior communicating artery normal. Anterior cerebral arteries patent to their distal aspects. M1 segments patent without stenosis or occlusion. No proximal M2 occlusion. Distal MCA branches well opacified and symmetric. Posterior circulation: Vertebral artery's patent to the vertebrobasilar junction without stenosis. Patent left PICA. Right PICA not visualized. Basilar artery widely patent. Superior cerebral arteries patent bilaterally. Both of the posterior cerebral arteries primarily supplied via the basilar and are patent to their distal aspects. Venous sinuses: Patent. Anatomic variants: No significant anatomic variant. No aneurysm or vascular malformation. Delayed phase: Not performed. Review of the MIP images confirms the above findings IMPRESSION: 1. Negative CTA for emergent large vessel occlusion. 2. Stable exam with mild atherosclerotic changes for patient age. No high-grade or flow-limiting stenosis. 3. Small layering bilateral pleural effusions with associated atelectasis. Critical Value/emergent results were called by telephone at the time of interpretation on 10/20/2016 at 5:42 am to Dr. Levander Campion, who verbally acknowledged these results. Electronically Signed   By: Rise Mu M.D.   On: 10/20/2016 06:04   Ct Angio Neck W Or Wo Contrast  Result Date: 10/18/2016 CLINICAL DATA:  Initial evaluation for acute right arm weakness, status post tPA. EXAM: CT ANGIOGRAPHY HEAD AND NECK TECHNIQUE: Multidetector CT imaging of the head and neck was performed using the standard protocol during bolus administration of intravenous contrast. Multiplanar CT image reconstructions and MIPs were  obtained to evaluate the vascular anatomy. Carotid stenosis measurements (when applicable) are obtained utilizing NASCET criteria, using the distal internal carotid diameter as the denominator. CONTRAST:  50 cc of Isovue 370. COMPARISON:  Prior CT from 10/17/2016. FINDINGS: CTA NECK FINDINGS Aortic arch: Visualized aortic arch of normal caliber with normal 3 vessel morphology. Minimal plaque about the origin of the great vessels without flow-limiting stenosis. Visualized subclavian artery is widely patent. Right carotid system: Right common and internal carotid artery's are widely patent without stenosis, dissection, or occlusion. No significant atheromatous narrowing about the right carotid bifurcation. Left carotid system: Left common carotid artery widely patent from its origin to the bifurcation. Mild a centric calcified plaque about the left carotid bifurcation without stenosis. Left ICA widely patent distally to the skullbase without stenosis, dissection, or occlusion. Vertebral arteries: Both of the vertebral arteries arise from the subclavian arteries. Vertebral arteries widely patent within the neck without stenosis, dissection, or occlusion. Skeleton: No acute osseous abnormality. No worrisome lytic or blastic osseous lesions. Moderate degenerative spondylolysis present at C5-6 and C6-7. Multilevel facet arthrosis noted within the upper cervical spine. Other neck: Soft tissues of the neck demonstrate no acute abnormality. Salivary glands within normal limits. No adenopathy. Thyroid normal. Upper chest: Visualized upper chest within normal limits. Partially visualized lungs are clear. Review of the MIP images confirms the above findings CTA HEAD FINDINGS Anterior circulation: Petrous, cavernous, and supraclinoid segments of the internal carotid arteries are widely patent without flow-limiting stenosis. Minimal scattered plaque within the cavernous right ICA noted. ICA termini widely patent. A1 segments  patent bilaterally. Anterior communicating artery normal. Anterior cerebral arteries patent to their distal aspects. M1 segments patent without stenosis or occlusion. No proximal M2 occlusion. Distal MCA branches well opacified and symmetric. Posterior circulation: Vertebral artery's widely patent to the vertebrobasilar junction. Left PICA patent. Dominant right AICA. Basilar artery widely patent. Superior cerebral arteries patent bilaterally. Both of the posterior cerebral artery supplied via the basilar and are widely patent to their distal aspects. Small left posterior communicating artery noted.  Venous sinuses: Patent. Anatomic variants: No significant anatomic variant. No aneurysm or vascular malformation. Delayed phase: Not performed. Review of the MIP images confirms the above findings IMPRESSION: 1. Negative CTA for emergent large vessel occlusion. 2. Minor atherosclerotic changes for patient age as above. No high-grade or flow-limiting stenosis. Critical Value/emergent results were discussed by telephone on 10/18/2016 at approximately 12:50 a.m. with Dr. Ritta Slot . Electronically Signed   By: Rise Mu M.D.   On: 10/18/2016 01:25   Mr Brain Wo Contrast  Result Date: 10/20/2016 CLINICAL DATA:  Slurred speech and right arm weakness. EXAM: MRI HEAD WITHOUT CONTRAST TECHNIQUE: Multiplanar, multiecho pulse sequences of the brain and surrounding structures were obtained without intravenous contrast. COMPARISON:  Brain MRI from 2 days ago FINDINGS: Brain: Interval restricted diffusion in the left frontal parietal cortex, involving the post more than precentral gyrus. Remote small left parietal cortically based infarct. Remote small vessel infarcts in the right caudate nucleus and bilateral cerebellum. Microvascular ischemic gliosis is mild. Chronic blood products associated with remote left parietal infarct. No acute hemorrhage, hydrocephalus, or masslike findings. Vascular: Major flow  voids are preserved. Preceding CTA earlier today. Skull and upper cervical spine: Negative for marrow lesion. Sinuses/Orbits: Bilateral cataract resection.  No acute finding. IMPRESSION: 1. Moderate acute infarct affecting the left post more than precentral gyri. 2. 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 Electronically Signed   By: Marnee Spring M.D.   On: 10/20/2016 10:07   Mr Brain Wo Contrast  Result Date: 10/19/2016 CLINICAL DATA:  Initial evaluation for acute right-sided weakness with aphasia. EXAM: MRI HEAD WITHOUT CONTRAST TECHNIQUE: Multiplanar, multiecho pulse sequences of the brain and surrounding structures were obtained without intravenous contrast. COMPARISON:  Comparison made with prior CT from 10/17/2016 as well as earlier the same day. FINDINGS: Brain: Diffuse prominence of the CSF containing spaces is compatible with generalized age-related cerebral atrophy. Remote lacunar infarcts present within the right basal ganglia. Multiple scatter remote bilateral cerebellar infarcts present as well. Remote cortical infarct with associated chronic blood products present within the left parietal lobe. Minimal for age chronic small vessel ischemic disease. No abnormal foci of restricted diffusion to suggest acute or subacute ischemia. Gray-white matter differentiation maintained. No evidence for acute intracranial hemorrhage. No mass lesion, midline shift or mass effect. No hydrocephalus. No extra-axial fluid collection. Major dural sinuses are grossly patent. Pituitary gland suprasellar region within normal limits. Vascular: Major intracranial vascular flow voids are maintained. Skull and upper cervical spine: Craniocervical junction within normal limits. Visualized upper cervical spine unremarkable. Bone marrow signal intensity within normal limits. No scalp soft tissue abnormality. Sinuses/Orbits: Globes and orbital soft tissues within normal  limits. Patient status post lens extraction bilaterally. Paranasal sinuses are clear. No mastoid effusion. Inner ear structures normal. IMPRESSION: 1. No acute intracranial infarct or other process identified. 2. Generalized age-related cerebral atrophy with mild chronic small vessel ischemic disease and scattered remote infarcts as above. Electronically Signed   By: Rise Mu M.D.   On: 10/19/2016 02:11   Dg Chest Port 1 View  Result Date: 10/21/2016 CLINICAL DATA:  Hx of afib, DM, HTN, TIA. Never smoker. EXAM: PORTABLE CHEST 1 VIEW COMPARISON:  10/20/2016 FINDINGS: Cardiac silhouette is mildly enlarged. No mediastinal or hilar masses. Vascular congestion noted previously has improved. Lungs are now essentially clear. No convincing pleural effusion.  No pneumothorax. IMPRESSION: 1. Improved lung aeration since the prior exam. No evidence of pneumonia or pulmonary edema.  Electronically Signed   By: Amie Portland M.D.   On: 10/21/2016 08:07   Dg Chest Portable 1 View  Result Date: 10/20/2016 CLINICAL DATA:  76 year old female with shortness of breath and vomiting. Hypoxia. EXAM: PORTABLE CHEST 1 VIEW COMPARISON:  None FINDINGS: Mild cardiomegaly with mild vascular congestion. Minimal bibasilar atelectatic changes noted. No focal consolidation, pleural effusion, or pneumothorax. No acute osseous pathology. IMPRESSION: Mild cardiomegaly with vascular congestion.  No focal consolidation. Electronically Signed   By: Elgie Collard M.D.   On: 10/20/2016 06:45   Ct Head Code Stroke W/o Cm  Addendum Date: 10/20/2016   ADDENDUM REPORT: 10/20/2016 07:04 ADDENDUM: Results were discussed by telephone with Dr. Levander Campion at approximately 5:30 a.m. on 10/20/2016. Additionally, there is a dictation in the initial report. Dr. Olin Pia was paged at 5:12 a.m. on 10/20/2016 (not 08/20/2016). Electronically Signed   By: Rise Mu M.D.   On: 10/20/2016 07:04   Result Date: 10/20/2016 CLINICAL  DATA:  Code stroke. Initial evaluation for acute right-sided weakness, slurred speech. EXAM: CT HEAD WITHOUT CONTRAST TECHNIQUE: Contiguous axial images were obtained from the base of the skull through the vertex without intravenous contrast. COMPARISON:  Prior MRI from 10/18/2016. FINDINGS: Brain: Stable atrophy with chronic small vessel ischemic disease. Remote lacunar infarct within the right basal ganglia. Scatter remote bilateral cerebellar infarcts, with additional remote posterior left MCA territory infarct. No acute intracranial hemorrhage. There is subtle loss of gray-white matter differentiation at the left frontal operculum and supra ganglionic posterior left frontal lobe (series 6, image 48), suspicious for evolving acute ischemic left MCA territory infarct. Left insula relatively maintained at this time. Basal ganglia and internal capsule maintained. No other acute evolving large vessel territory infarct. No mass lesion or midline shift. No hydrocephalus. No extra-axial fluid collection. Vascular: No hyperdense vessel. Skull: Scalp soft tissues and calvarium within normal limits. Sinuses/Orbits: Globes and orbital soft tissues within normal limits. Patient status post lens extraction bilaterally. Paranasal sinuses and mastoids are clear. ASPECTS [facility] Stroke Program Early CT Score) - Ganglionic level infarction (caudate, lentiform nuclei, internal capsule, insula, M1-M3 cortex): 6 - Supraganglionic infarction (M4-M6 cortex): 2 Total score (0-10 with 10 being normal): 8 IMPRESSION: 1. Subtle loss of gray-white matter differentiation at the left frontal operculum and posterior left frontal region as above, suspicious for acute ischemic left MCA territory infarct. No intracranial hemorrhage. 2. ASPECTS is 8. 3. Stable atrophy with scattered remote infarcts as above. Dr. Olin Pia, of the Stroke Neurology service has been paged regarding these findings at 5:12 a.m. on 08/20/2016. Currently awaiting a  call back. Results will be conveyed as soon as possible. Electronically Signed: By: Rise Mu M.D. On: 10/20/2016 05:17    DISCHARGE EXAMINATION: Vitals:   10/22/16 2114 10/23/16 0108 10/23/16 0520 10/23/16 1018  BP: 126/69 (!) 113/51 (!) 107/58 (!) 121/52  Pulse: 82 68 68 84  Resp: 20 20 20 20   Temp: 99.3 F (37.4 C) 98.8 F (37.1 C) 98.7 F (37.1 C) 98.6 F (37 C)  TempSrc: Oral Oral Oral Oral  SpO2: 99% 99% 99% 92%  Weight:      Height:       General appearance: alert, cooperative, appears stated age and no distress Resp: clear to auscultation bilaterally Cardio: regular rate and rhythm, S1, S2 normal, no murmur, click, rub or gallop GI: soft, non-tender; bowel sounds normal; no masses,  no organomegaly Extremities: extremities normal, atraumatic, no cyanosis or edema Neurologic: Continues to have some dysarthria. Continues to  have right upper extremity weakness but stable. No other deficits noted.  DISPOSITION: Home with family  Discharge Instructions    Call MD for:  difficulty breathing, headache or visual disturbances    Complete by:  As directed    Call MD for:  extreme fatigue    Complete by:  As directed    Call MD for:  persistant dizziness or light-headedness    Complete by:  As directed    Call MD for:  persistant nausea and vomiting    Complete by:  As directed    Call MD for:  severe uncontrolled pain    Complete by:  As directed    Call MD for:  temperature >100.4    Complete by:  As directed    Diet - low sodium heart healthy    Complete by:  As directed    Diet Carb Modified    Complete by:  As directed    Discharge instructions    Complete by:  As directed    As we discussed, please be sure to follow-up with your cardiologist or primary care provider on Thursday or Friday to have your PT/INR checked. Till then take your Lovenox injections and warfarin as prescribed/recommended. Once your INR is between 2 and 3, the Lovenox can be stopped.  Talk to your primary care provider or  cardiologist about arranging for home health. Please have them refer you to a local stroke neurologist. Please also have your primary care provider check your thyroid function tests including TSH and Free T4 in 4-6 weeks.  You were cared for by a hospitalist during your hospital stay. If you have any questions about your discharge medications or the care you received while you were in the hospital after you are discharged, you can call the unit and asked to speak with the hospitalist on call if the hospitalist that took care of you is not available. Once you are discharged, your primary care physician will handle any further medical issues. Please note that NO REFILLS for any discharge medications will be authorized once you are discharged, as it is imperative that you return to your primary care physician (or establish a relationship with a primary care physician if you do not have one) for your aftercare needs so that they can reassess your need for medications and monitor your lab values. If you do not have a primary care physician, you can call [phone] for a physician referral.   Increase activity slowly    Complete by:  As directed       ALLERGIES:  Allergies  Allergen Reactions  . Eggs Or Egg-Derived Products     Reaction unknown to patient/family     Current Discharge Medication List    START taking these medications   Details  atorvastatin (LIPITOR) 40 MG tablet Take 1 tablet (40 mg total) by mouth daily at 6 PM. Qty: 30 tablet, Refills: 1    docusate sodium (COLACE) 100 MG capsule Take 1 capsule (100 mg total) by mouth 2 (two) times daily. Qty: 60 capsule, Refills: 0    enoxaparin (LOVENOX) 80 MG/0.8ML injection Inject 0.8 mLs (80 mg total) into the skin every 12 (twelve) hours. Qty: 10 Syringe, Refills: 0    polyethylene glycol (MIRALAX / GLYCOLAX) packet Take 17 g by mouth daily as needed for moderate constipation. Qty: 14 each,  Refills: 0    UNABLE TO FIND Patient needs home health physical therapy and occupational therapy for acute stroke. She will  also need to be seen by speech therapy. Qty: 1 each, Refills: 0      CONTINUE these medications which have CHANGED   Details  warfarin (COUMADIN) 5 MG tablet Take 1 tablet (5 mg total) by mouth daily at 6 PM. Qty: 30 tablet, Refills: 0      CONTINUE these medications which have NOT CHANGED   Details  acetaminophen (TYLENOL) 325 MG tablet Take 325 mg by mouth every 6 (six) hours as needed for mild pain.    furosemide (LASIX) 40 MG tablet Take 40 mg by mouth daily.     metFORMIN (GLUCOPHAGE) 500 MG tablet Take 250 mg by mouth at bedtime.    metoprolol tartrate (LOPRESSOR) 50 MG tablet Take 75 mg by mouth 2 (two) times daily.    potassium chloride SA (K-DUR,KLOR-CON) 20 MEQ tablet Take 20 mEq by mouth every morning.       STOP taking these medications     aspirin EC 81 MG EC tablet      simvastatin (ZOCOR) 20 MG tablet          Follow-up Information    [facility] - Dme Follow up.   Why:  3 prong cane will be brouht up to patient's room Contact information: [address] [place] [address] [phone]        [facility] Follow up.   Why:  HHPT, HHOT, HH aide, HHST, Contact information: Home Health Agency [phone]          TOTAL DISCHARGE TIME: 35 mins  [facility]  [facility] Pager [phone]  10/23/2016, 1:35 PM

## 2016-10-23 NOTE — Progress Notes (Signed)
Patient given discharge instructions along with scripts.  Family at chairside.  All questions and concerns addressed.

## 2017-01-29 ENCOUNTER — Other Ambulatory Visit: Payer: Self-pay

## 2017-01-30 ENCOUNTER — Other Ambulatory Visit: Payer: Self-pay

## 2017-01-31 ENCOUNTER — Other Ambulatory Visit: Payer: Self-pay

## 2017-01-31 NOTE — Patient Outreach (Signed)
First/Second/Third attempt to obtain mRS. No answer. Left message for returned call. mRs = 7  

## 2019-03-07 IMAGING — CT CT HEAD CODE STROKE
4 series · 16 of 47 positions shown, 18 images · non-contrast
Comparison: Prior MRI from 10/18/2016.

ADDENDUM:
Results were discussed by telephone with Dr. Rtoyota Joshjax at
approximately [DATE] a.m. on 10/20/2016. Additionally, there is a
dictation in the initial report. Dr. Kathl was paged at [DATE]
a.m. on 10/20/2016 (not 08/20/2016).
CLINICAL DATA: Code stroke. Initial evaluation for acute
right-sided weakness, slurred speech.

EXAM:
CT HEAD WITHOUT CONTRAST
TECHNIQUE: Contiguous axial images were obtained from the base of the skull
through the vertex without intravenous contrast.

[Series 3: head wo · axial · 0.43mm/px · z∈[-104,+16]mm · 7 of 33 slices shown, 9 images]
[im 5/33  brain]
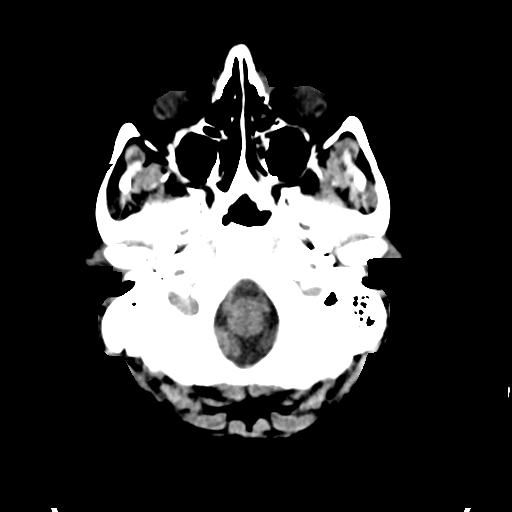
[im 5/33  bone]
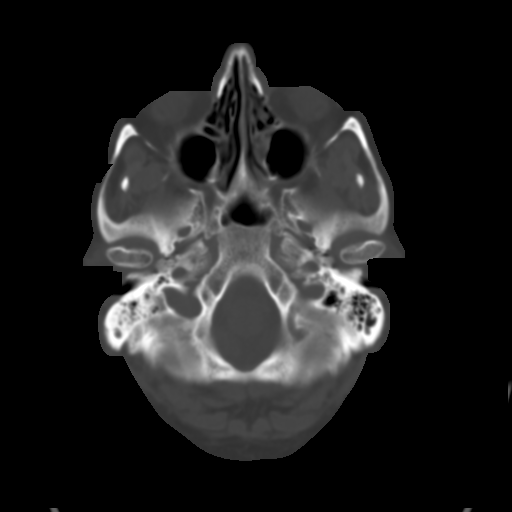
[im 9/33  brain]
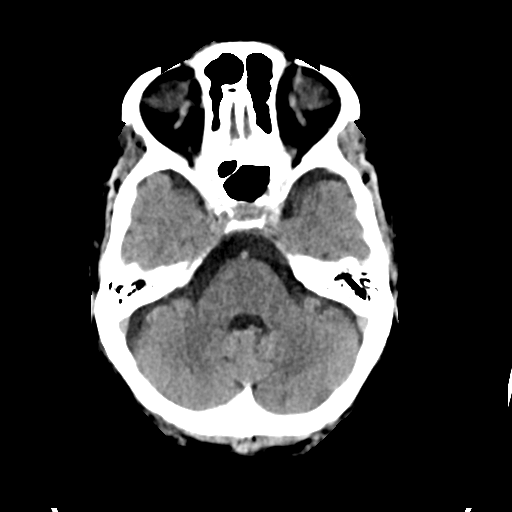
[im 13/33  brain]
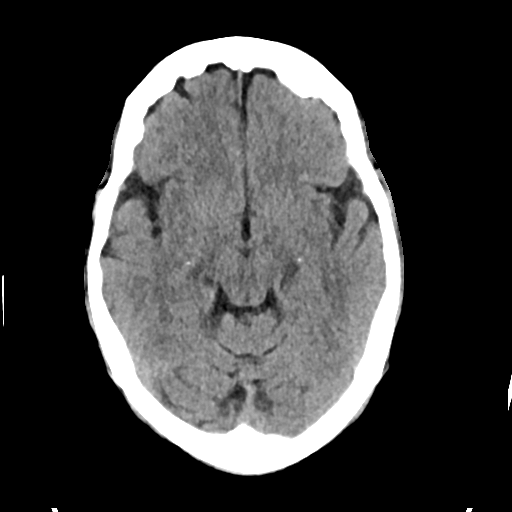
[im 17/33  brain]
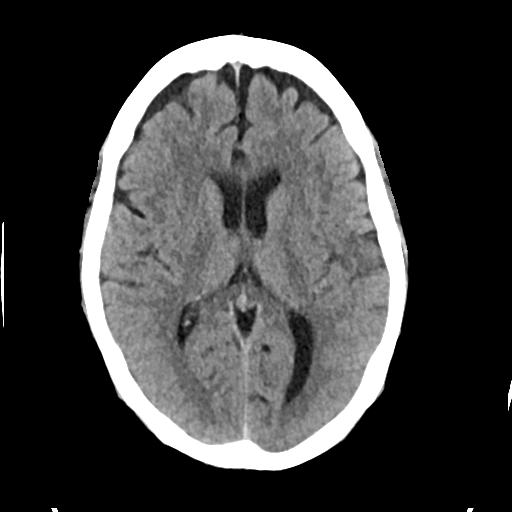
[im 21/33  brain]
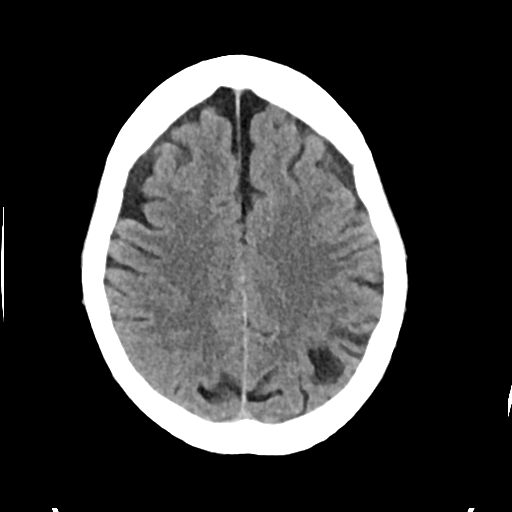
[im 21/33  bone]
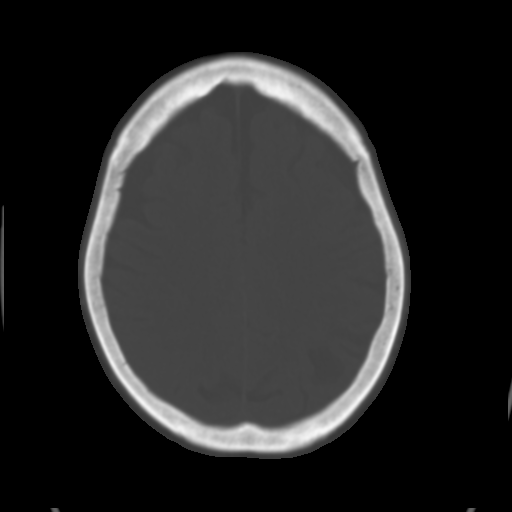
[im 25/33  brain]
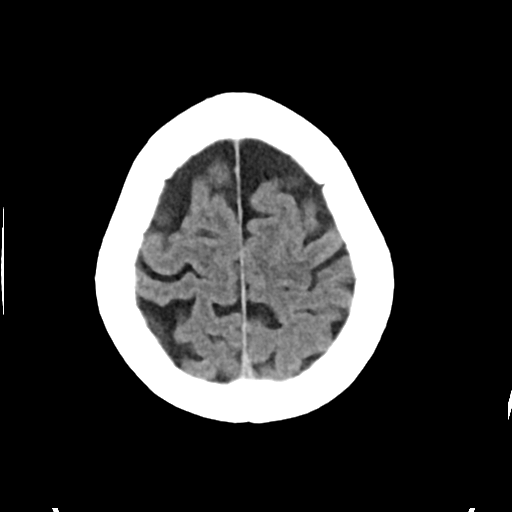
[im 29/33  brain]
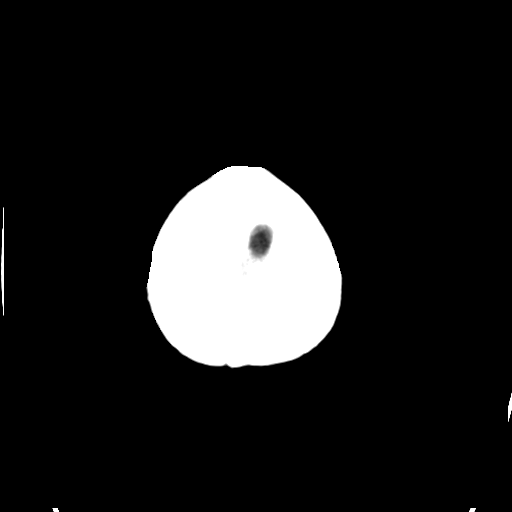

[Series 4: head bone · axial · 0.43mm/px · z∈[-108,-76]mm · 3 of 83 slices shown]
[im 9/83  bone]
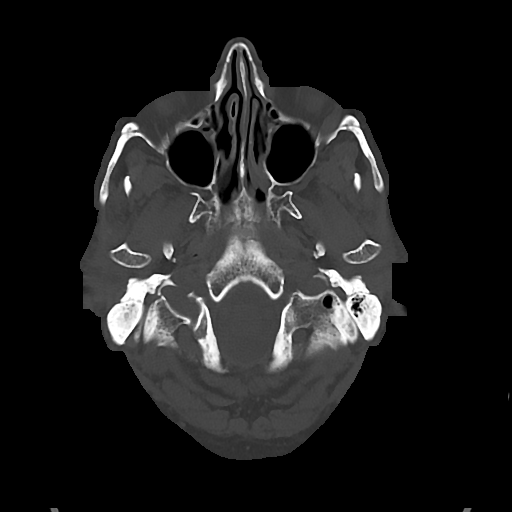
[im 17/83  bone]
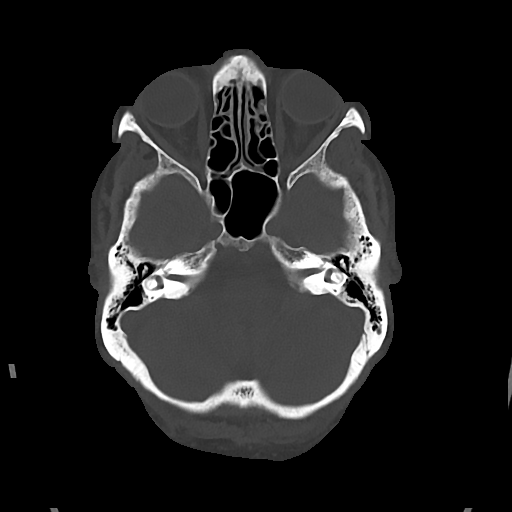
[im 25/83  bone]
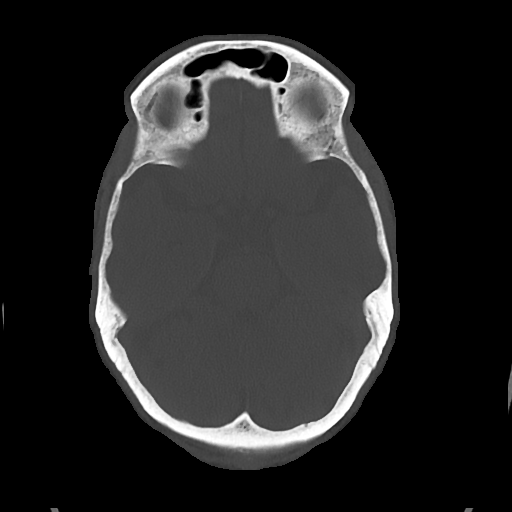

[Series 5: cor soft · coronal · 0.34mm/px · 3 of 69 slices shown]
[im 23/69  brain]
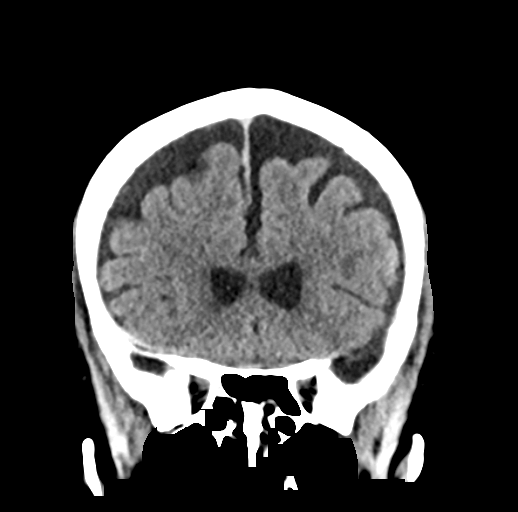
[im 31/69  brain]
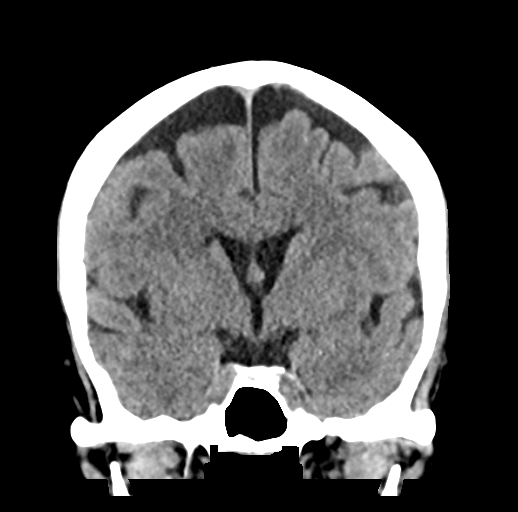
[im 38/69  brain]
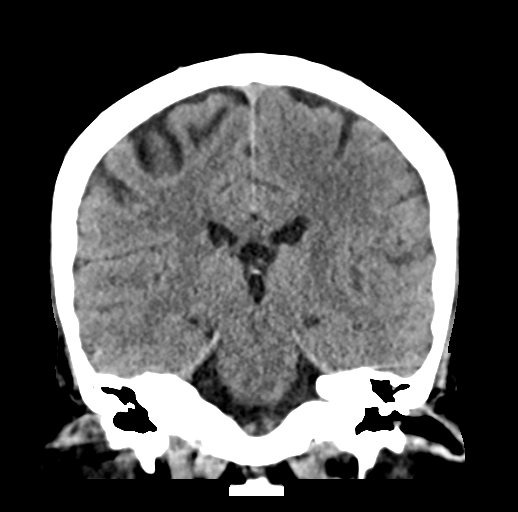

[Series 6: sag soft · sagittal · 0.34mm/px · 3 of 57 slices shown]
[im 19/57  brain]
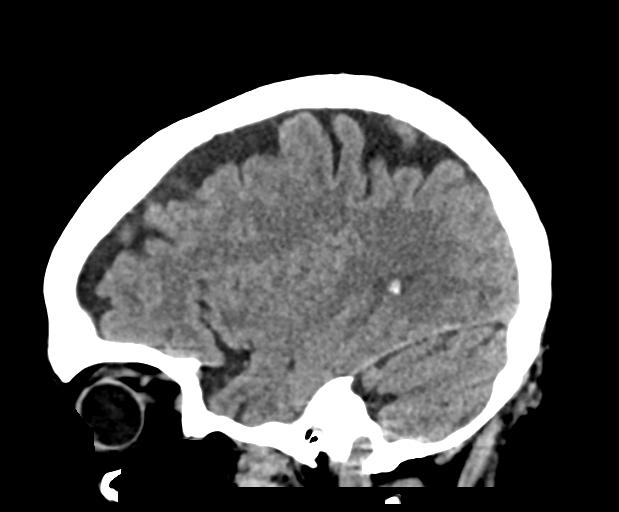
[im 29/57  brain]
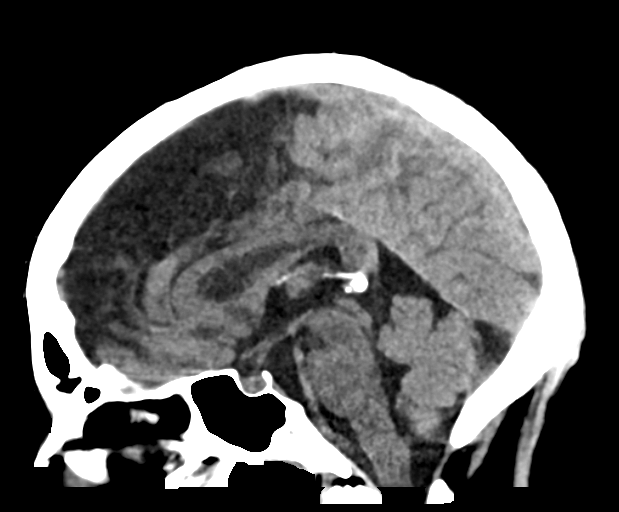
[im 38/57  brain]
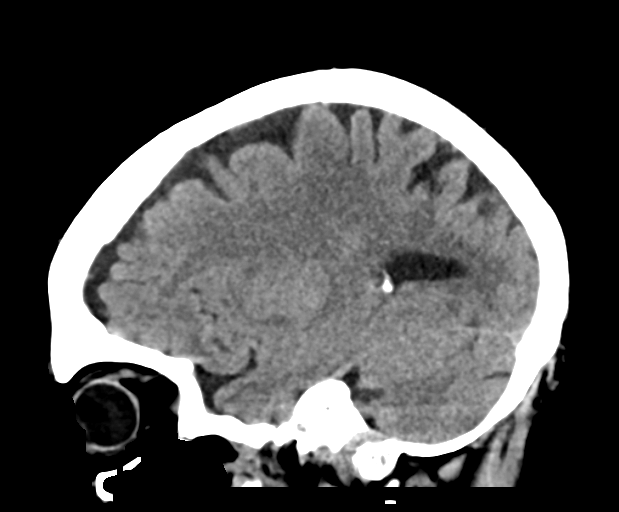

[16 of 47 positions shown; findings below may reference images not displayed]

FINDINGS: Brain: Stable atrophy with chronic small vessel ischemic disease.
Remote lacunar infarct within the right basal ganglia. Scatter
remote bilateral cerebellar infarcts, with additional remote
posterior left MCA territory infarct.

No acute intracranial hemorrhage. There is subtle loss of gray-white
matter differentiation at the left frontal operculum and supra
ganglionic posterior left frontal lobe (series 6, image 48),
suspicious for evolving acute ischemic left MCA territory infarct.
Left insula relatively maintained at this time. Basal ganglia and
internal capsule maintained. No other acute evolving large vessel
territory infarct.

No mass lesion or midline shift. No hydrocephalus. No extra-axial
fluid collection.

Vascular: No hyperdense vessel.

Skull: Scalp soft tissues and calvarium within normal limits.

Sinuses/Orbits: Globes and orbital soft tissues within normal
limits. Patient status post lens extraction bilaterally. Paranasal
sinuses and mastoids are clear.

ASPECTS ([place] Stroke Program Early CT Score)

- Ganglionic level infarction (caudate, lentiform nuclei, internal
capsule, insula, M1-M3 cortex): 6

- Supraganglionic infarction (M4-M6 cortex): 2

Total score (0-10 with 10 being normal): 8
IMPRESSION: 1. Subtle loss of gray-white matter differentiation at the left
frontal operculum and posterior left frontal region as above,
suspicious for acute ischemic left MCA territory infarct. No
intracranial hemorrhage.
2. ASPECTS is 8.
3. Stable atrophy with scattered remote infarcts as above.

Dr. Kathl, of the [REDACTED] has been paged
regarding these findings at [DATE] a.m. on 08/20/2016. Currently
awaiting a call back. Results will be conveyed as soon as possible.

## 2019-03-09 IMAGING — CT CT HEAD W/O CM
4 series · 15 of 47 positions shown, 17 images · non-contrast
Comparison: MR brain 10/20/2016.

CLINICAL DATA: Continued surveillance of stroke.

EXAM:
CT HEAD WITHOUT CONTRAST
TECHNIQUE: Contiguous axial images were obtained from the base of the skull
through the vertex without intravenous contrast.

[Series 3: head wo · axial · 0.41mm/px · z∈[-140,-25]mm · 7 of 31 slices shown, 9 images]
[im 4/31  brain]
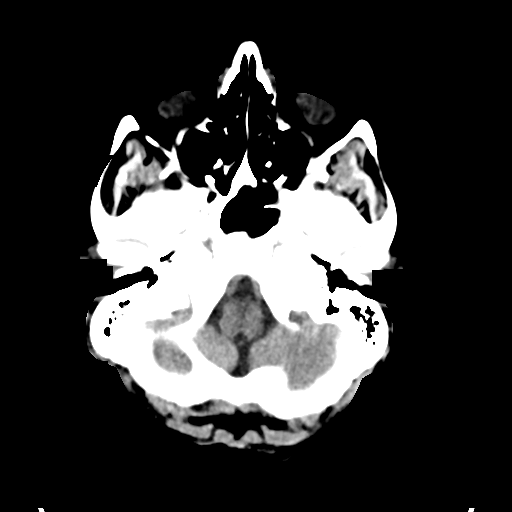
[im 4/31  bone]
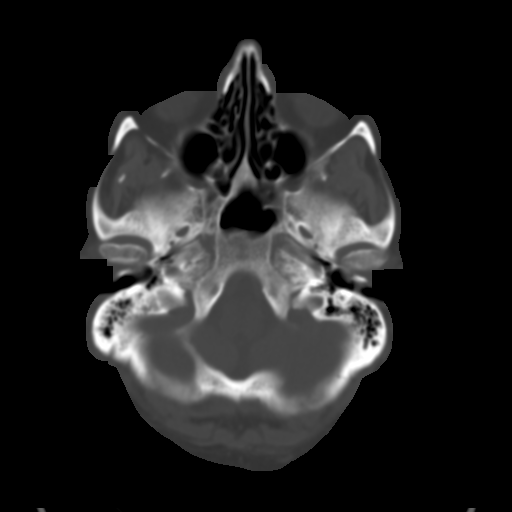
[im 8/31  brain]
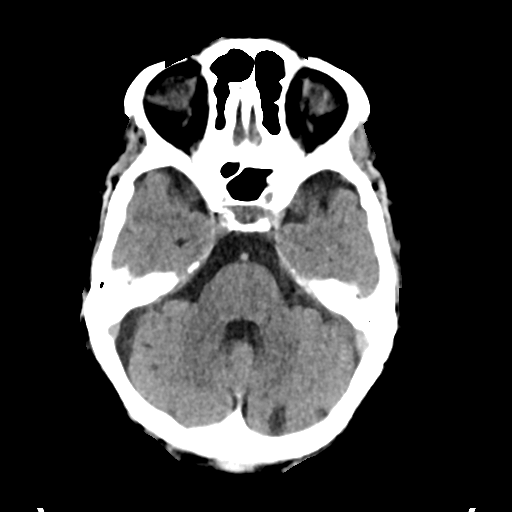
[im 12/31  brain]
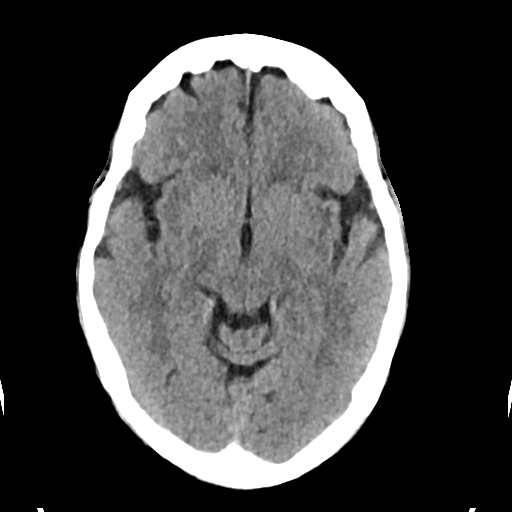
[im 16/31  brain]
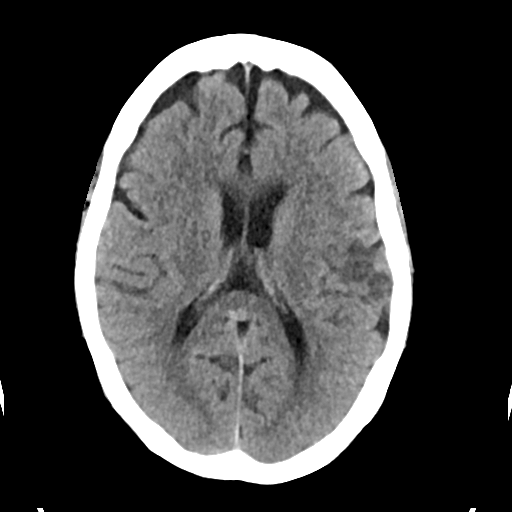
[im 19/31  brain]
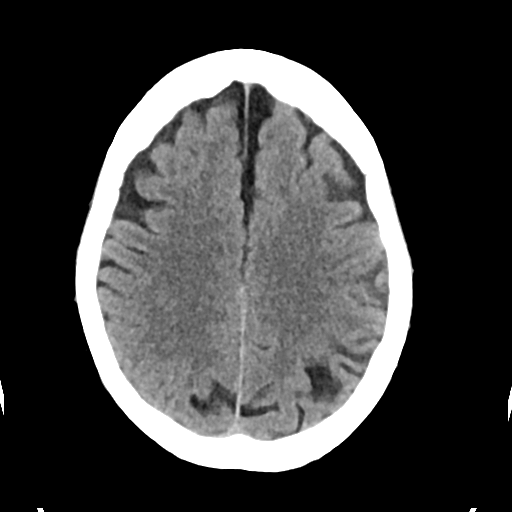
[im 19/31  bone]
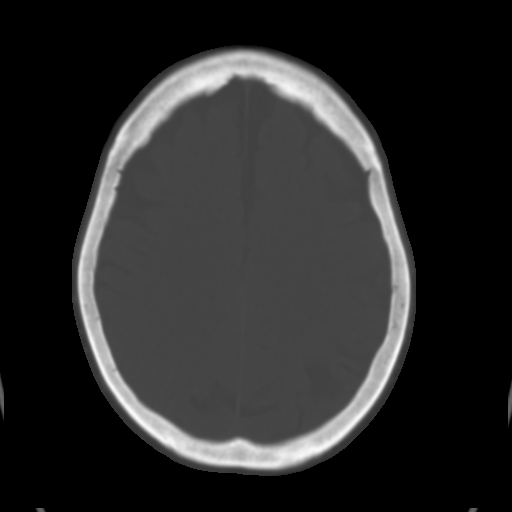
[im 23/31  brain]
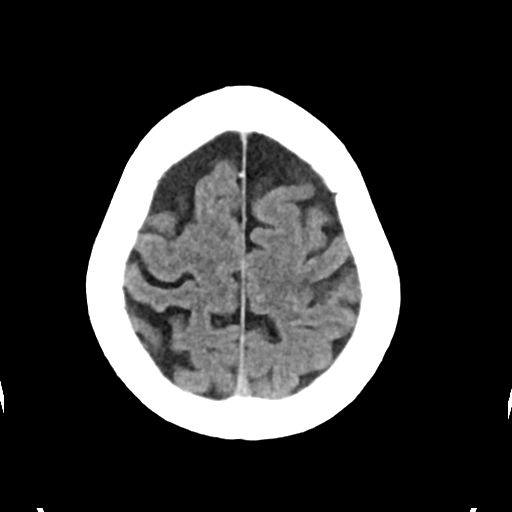
[im 27/31  brain]
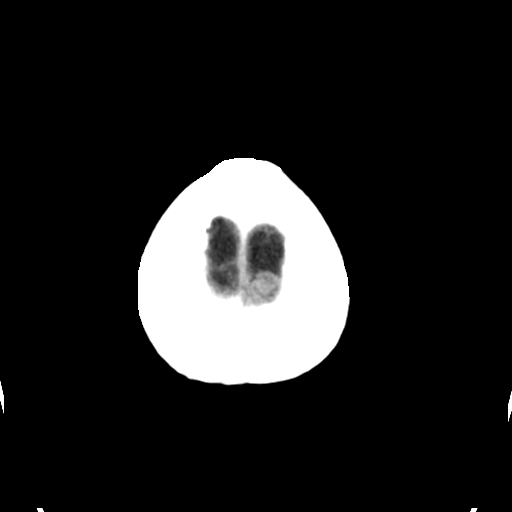

[Series 4: head bone · axial · 0.41mm/px · z∈[-141,-125]mm · 2 of 77 slices shown]
[im 8/77  bone]
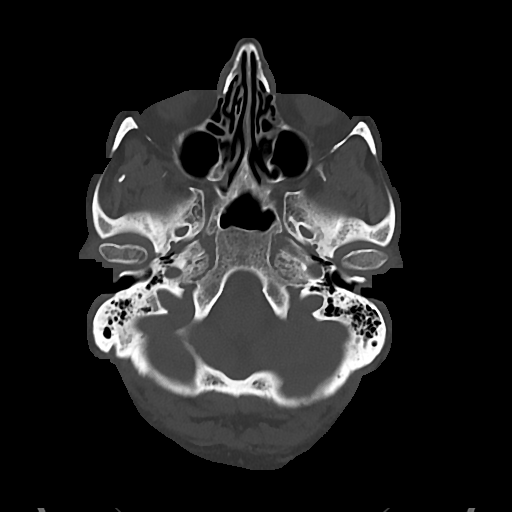
[im 16/77  bone]
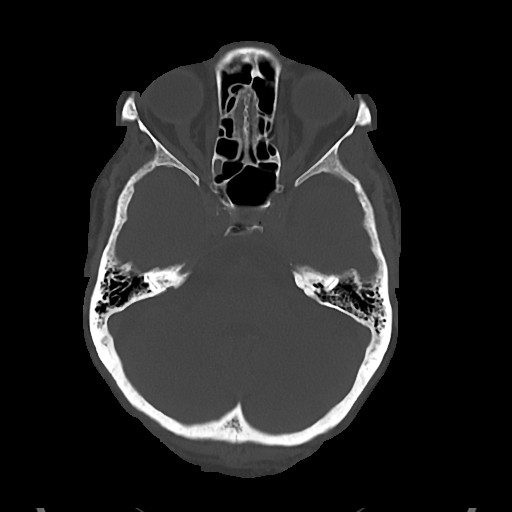

[Series 5: cor soft · coronal · 0.30mm/px · 3 of 67 slices shown]
[im 23/67  brain]
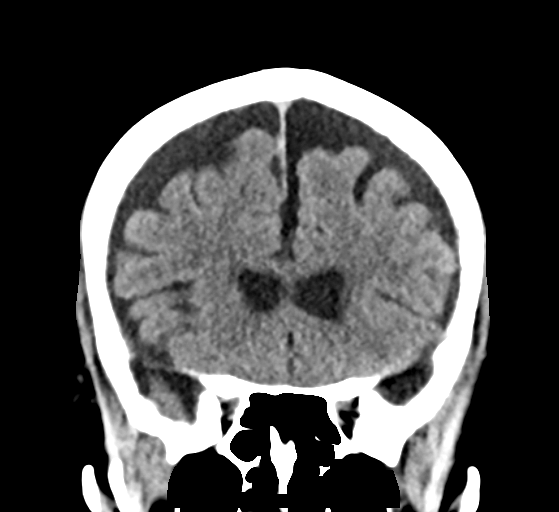
[im 30/67  brain]
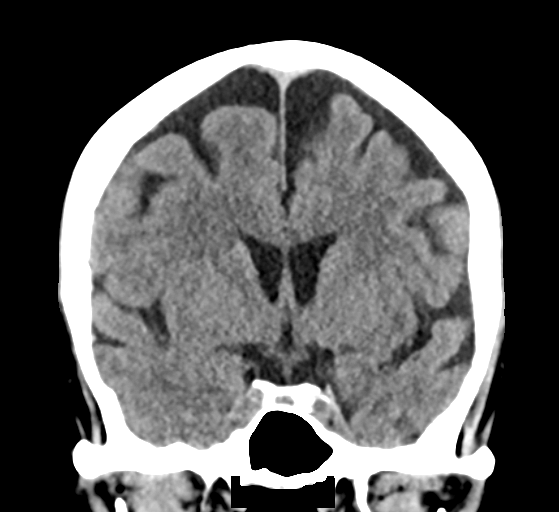
[im 37/67  brain]
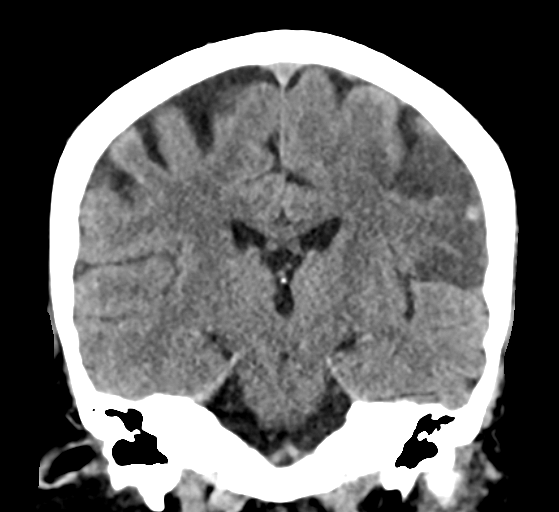

[Series 6: sag soft · sagittal · 0.30mm/px · 3 of 67 slices shown]
[im 23/67  brain]
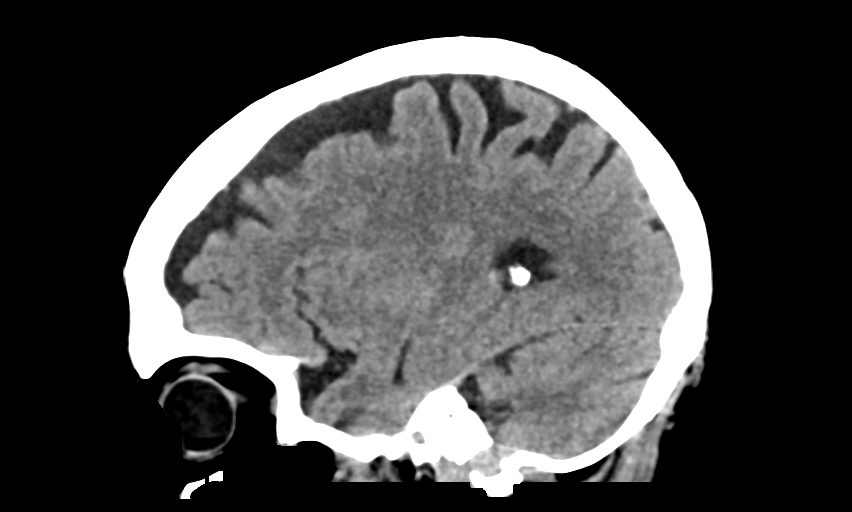
[im 34/67  brain]
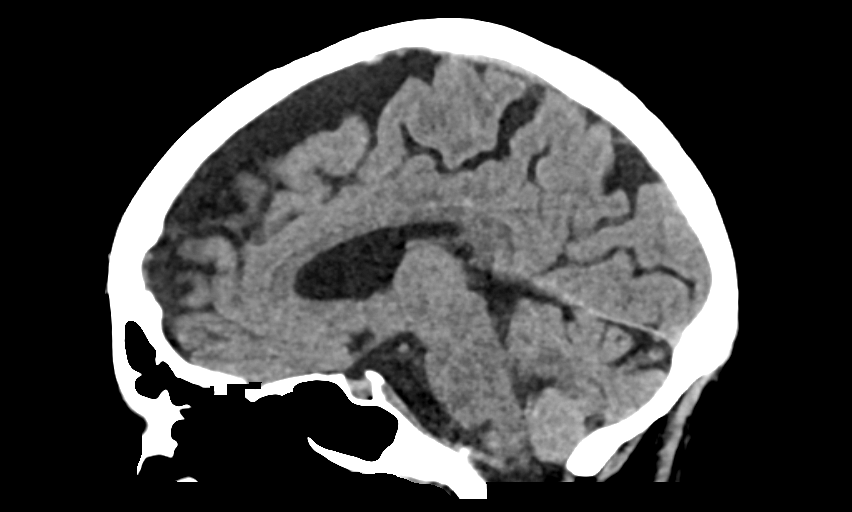
[im 45/67  brain]
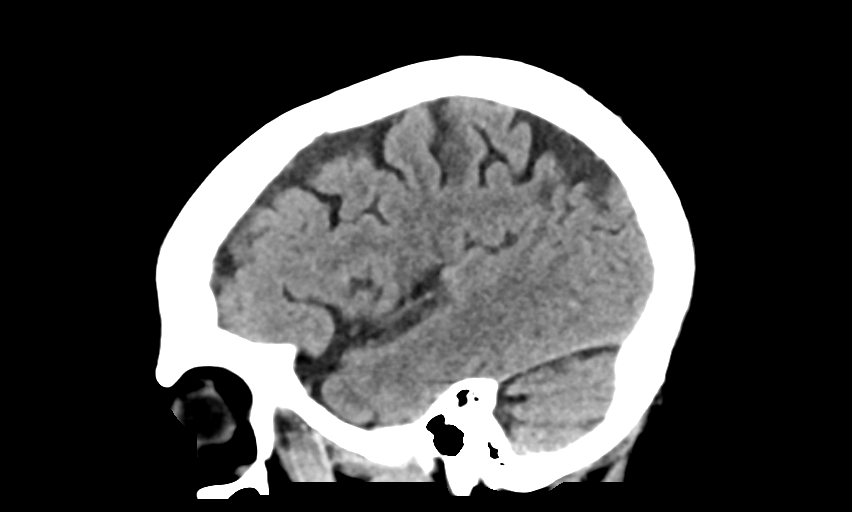

[15 of 47 positions shown; findings below may reference images not displayed]

FINDINGS: Brain: Continued evolution of well-defined cytotoxic edema affecting
primarily the postcentral gyrus, and LEFT frontal opercular cortex
adjacent to the sylvian fissure. No hemorrhage. No midline shift.

Vascular: No hyperdense vessel or unexpected calcification.

Skull: Normal. Negative for fracture or focal lesion.

Sinuses/Orbits: No acute finding.

Other: None.
IMPRESSION: Increasingly well-defined LEFT hemisphere LEFT MCA territory
cerebral infarction. No hemorrhagic transformation.
